# Patient Record
Sex: Male | Born: 1935 | Race: White | Hispanic: No | Marital: Married | Smoking: Former smoker
Health system: Southern US, Community
[De-identification: ages and names within clinical notes are randomized; demographics above are authoritative.]

## PROBLEM LIST (undated history)

## (undated) DIAGNOSIS — I739 Peripheral vascular disease, unspecified: Secondary | ICD-10-CM

## (undated) DIAGNOSIS — I6523 Occlusion and stenosis of bilateral carotid arteries: Secondary | ICD-10-CM

## (undated) DIAGNOSIS — G629 Polyneuropathy, unspecified: Secondary | ICD-10-CM

## (undated) DIAGNOSIS — I609 Nontraumatic subarachnoid hemorrhage, unspecified: Secondary | ICD-10-CM

## (undated) DIAGNOSIS — I2510 Atherosclerotic heart disease of native coronary artery without angina pectoris: Secondary | ICD-10-CM

## (undated) DIAGNOSIS — E119 Type 2 diabetes mellitus without complications: Secondary | ICD-10-CM

## (undated) HISTORY — PX: CORONARY ARTERY BYPASS GRAFT: SHX141

## (undated) HISTORY — PX: CHOLECYSTECTOMY: SHX55

## (undated) HISTORY — PX: HERNIA REPAIR: SHX51

---

## 1997-05-19 HISTORY — PX: CAROTID ENDARTERECTOMY: SUR193

## 2004-04-09 ENCOUNTER — Encounter: Payer: Self-pay | Admitting: *Deleted

## 2004-04-18 ENCOUNTER — Encounter: Payer: Self-pay | Admitting: *Deleted

## 2004-05-19 ENCOUNTER — Encounter: Payer: Self-pay | Admitting: *Deleted

## 2007-11-09 ENCOUNTER — Ambulatory Visit: Payer: Self-pay | Admitting: Internal Medicine

## 2007-11-10 ENCOUNTER — Ambulatory Visit: Payer: Self-pay | Admitting: Internal Medicine

## 2007-11-17 ENCOUNTER — Ambulatory Visit: Payer: Self-pay | Admitting: Internal Medicine

## 2007-12-18 ENCOUNTER — Ambulatory Visit: Payer: Self-pay | Admitting: Internal Medicine

## 2008-01-18 ENCOUNTER — Ambulatory Visit: Payer: Self-pay | Admitting: Internal Medicine

## 2008-02-17 ENCOUNTER — Ambulatory Visit: Payer: Self-pay | Admitting: Internal Medicine

## 2008-03-19 ENCOUNTER — Ambulatory Visit: Payer: Self-pay | Admitting: Internal Medicine

## 2008-04-04 ENCOUNTER — Ambulatory Visit: Payer: Self-pay | Admitting: Internal Medicine

## 2008-04-18 ENCOUNTER — Ambulatory Visit: Payer: Self-pay | Admitting: Internal Medicine

## 2008-06-19 ENCOUNTER — Ambulatory Visit: Payer: Self-pay | Admitting: Internal Medicine

## 2008-07-10 ENCOUNTER — Ambulatory Visit: Payer: Self-pay | Admitting: Internal Medicine

## 2008-07-17 ENCOUNTER — Ambulatory Visit: Payer: Self-pay | Admitting: Internal Medicine

## 2008-08-17 ENCOUNTER — Ambulatory Visit: Payer: Self-pay | Admitting: Internal Medicine

## 2008-10-12 ENCOUNTER — Ambulatory Visit: Payer: Self-pay | Admitting: Internal Medicine

## 2008-10-17 ENCOUNTER — Ambulatory Visit: Payer: Self-pay | Admitting: Internal Medicine

## 2008-12-17 ENCOUNTER — Ambulatory Visit: Payer: Self-pay | Admitting: Internal Medicine

## 2008-12-18 ENCOUNTER — Ambulatory Visit: Payer: Self-pay | Admitting: Internal Medicine

## 2009-01-17 ENCOUNTER — Ambulatory Visit: Payer: Self-pay | Admitting: Internal Medicine

## 2009-02-16 ENCOUNTER — Ambulatory Visit: Payer: Self-pay | Admitting: Internal Medicine

## 2009-03-19 ENCOUNTER — Ambulatory Visit: Payer: Self-pay | Admitting: Internal Medicine

## 2009-05-19 ENCOUNTER — Ambulatory Visit: Payer: Self-pay | Admitting: Internal Medicine

## 2009-05-24 ENCOUNTER — Ambulatory Visit: Payer: Self-pay | Admitting: Internal Medicine

## 2009-06-19 ENCOUNTER — Ambulatory Visit: Payer: Self-pay | Admitting: Internal Medicine

## 2009-08-22 ENCOUNTER — Ambulatory Visit: Payer: Self-pay | Admitting: Internal Medicine

## 2009-09-16 ENCOUNTER — Ambulatory Visit: Payer: Self-pay | Admitting: Internal Medicine

## 2009-11-16 ENCOUNTER — Ambulatory Visit: Payer: Self-pay | Admitting: Internal Medicine

## 2009-11-23 ENCOUNTER — Ambulatory Visit: Payer: Self-pay | Admitting: Internal Medicine

## 2009-12-17 ENCOUNTER — Ambulatory Visit: Payer: Self-pay | Admitting: Internal Medicine

## 2010-02-16 ENCOUNTER — Ambulatory Visit: Payer: Self-pay | Admitting: Internal Medicine

## 2010-03-01 ENCOUNTER — Ambulatory Visit: Payer: Self-pay | Admitting: Internal Medicine

## 2010-03-19 ENCOUNTER — Ambulatory Visit: Payer: Self-pay | Admitting: Internal Medicine

## 2010-05-31 ENCOUNTER — Ambulatory Visit: Payer: Self-pay | Admitting: Internal Medicine

## 2017-11-05 ENCOUNTER — Encounter (HOSPITAL_COMMUNITY): Payer: Self-pay | Admitting: Emergency Medicine

## 2017-11-05 ENCOUNTER — Emergency Department (HOSPITAL_COMMUNITY): Payer: Medicare HMO

## 2017-11-05 ENCOUNTER — Inpatient Hospital Stay (HOSPITAL_COMMUNITY)
Admission: EM | Admit: 2017-11-05 | Discharge: 2017-11-10 | DRG: 493 | Disposition: A | Discharge status: Skilled Nursing Facility | Payer: Medicare HMO | Attending: Internal Medicine | Admitting: Internal Medicine

## 2017-11-05 DIAGNOSIS — H9190 Unspecified hearing loss, unspecified ear: Secondary | ICD-10-CM | POA: Diagnosis present

## 2017-11-05 DIAGNOSIS — I2510 Atherosclerotic heart disease of native coronary artery without angina pectoris: Secondary | ICD-10-CM | POA: Diagnosis present

## 2017-11-05 DIAGNOSIS — S82201D Unspecified fracture of shaft of right tibia, subsequent encounter for closed fracture with routine healing: Secondary | ICD-10-CM | POA: Diagnosis present

## 2017-11-05 DIAGNOSIS — S82441A Displaced spiral fracture of shaft of right fibula, initial encounter for closed fracture: Secondary | ICD-10-CM | POA: Diagnosis not present

## 2017-11-05 DIAGNOSIS — S82241A Displaced spiral fracture of shaft of right tibia, initial encounter for closed fracture: Principal | ICD-10-CM | POA: Diagnosis present

## 2017-11-05 DIAGNOSIS — Z951 Presence of aortocoronary bypass graft: Secondary | ICD-10-CM

## 2017-11-05 DIAGNOSIS — Z7901 Long term (current) use of anticoagulants: Secondary | ICD-10-CM

## 2017-11-05 DIAGNOSIS — E1142 Type 2 diabetes mellitus with diabetic polyneuropathy: Secondary | ICD-10-CM | POA: Diagnosis present

## 2017-11-05 DIAGNOSIS — S82201A Unspecified fracture of shaft of right tibia, initial encounter for closed fracture: Secondary | ICD-10-CM

## 2017-11-05 DIAGNOSIS — I482 Chronic atrial fibrillation, unspecified: Secondary | ICD-10-CM

## 2017-11-05 DIAGNOSIS — I110 Hypertensive heart disease with heart failure: Secondary | ICD-10-CM | POA: Diagnosis present

## 2017-11-05 DIAGNOSIS — I5032 Chronic diastolic (congestive) heart failure: Secondary | ICD-10-CM | POA: Diagnosis present

## 2017-11-05 DIAGNOSIS — Z880 Allergy status to penicillin: Secondary | ICD-10-CM

## 2017-11-05 DIAGNOSIS — E1151 Type 2 diabetes mellitus with diabetic peripheral angiopathy without gangrene: Secondary | ICD-10-CM | POA: Diagnosis present

## 2017-11-05 DIAGNOSIS — W1839XA Other fall on same level, initial encounter: Secondary | ICD-10-CM | POA: Diagnosis not present

## 2017-11-05 DIAGNOSIS — S82401A Unspecified fracture of shaft of right fibula, initial encounter for closed fracture: Secondary | ICD-10-CM

## 2017-11-05 DIAGNOSIS — Z7902 Long term (current) use of antithrombotics/antiplatelets: Secondary | ICD-10-CM

## 2017-11-05 DIAGNOSIS — D696 Thrombocytopenia, unspecified: Secondary | ICD-10-CM | POA: Diagnosis present

## 2017-11-05 DIAGNOSIS — M79604 Pain in right leg: Secondary | ICD-10-CM | POA: Diagnosis present

## 2017-11-05 DIAGNOSIS — Z7984 Long term (current) use of oral hypoglycemic drugs: Secondary | ICD-10-CM

## 2017-11-05 DIAGNOSIS — Z9049 Acquired absence of other specified parts of digestive tract: Secondary | ICD-10-CM | POA: Diagnosis not present

## 2017-11-05 DIAGNOSIS — W010XXA Fall on same level from slipping, tripping and stumbling without subsequent striking against object, initial encounter: Secondary | ICD-10-CM | POA: Diagnosis present

## 2017-11-05 DIAGNOSIS — S82209A Unspecified fracture of shaft of unspecified tibia, initial encounter for closed fracture: Secondary | ICD-10-CM

## 2017-11-05 DIAGNOSIS — I6523 Occlusion and stenosis of bilateral carotid arteries: Secondary | ICD-10-CM | POA: Diagnosis present

## 2017-11-05 DIAGNOSIS — I480 Paroxysmal atrial fibrillation: Secondary | ICD-10-CM | POA: Diagnosis present

## 2017-11-05 DIAGNOSIS — E119 Type 2 diabetes mellitus without complications: Secondary | ICD-10-CM

## 2017-11-05 DIAGNOSIS — R791 Abnormal coagulation profile: Secondary | ICD-10-CM | POA: Diagnosis not present

## 2017-11-05 DIAGNOSIS — F17220 Nicotine dependence, chewing tobacco, uncomplicated: Secondary | ICD-10-CM | POA: Diagnosis present

## 2017-11-05 DIAGNOSIS — S82401D Unspecified fracture of shaft of right fibula, subsequent encounter for closed fracture with routine healing: Secondary | ICD-10-CM

## 2017-11-05 DIAGNOSIS — I5030 Unspecified diastolic (congestive) heart failure: Secondary | ICD-10-CM | POA: Diagnosis not present

## 2017-11-05 DIAGNOSIS — Z7951 Long term (current) use of inhaled steroids: Secondary | ICD-10-CM

## 2017-11-05 DIAGNOSIS — E11649 Type 2 diabetes mellitus with hypoglycemia without coma: Secondary | ICD-10-CM | POA: Diagnosis present

## 2017-11-05 DIAGNOSIS — Y93H2 Activity, gardening and landscaping: Secondary | ICD-10-CM

## 2017-11-05 DIAGNOSIS — T148XXA Other injury of unspecified body region, initial encounter: Secondary | ICD-10-CM

## 2017-11-05 DIAGNOSIS — W19XXXA Unspecified fall, initial encounter: Secondary | ICD-10-CM

## 2017-11-05 DIAGNOSIS — J449 Chronic obstructive pulmonary disease, unspecified: Secondary | ICD-10-CM | POA: Diagnosis present

## 2017-11-05 DIAGNOSIS — I739 Peripheral vascular disease, unspecified: Secondary | ICD-10-CM

## 2017-11-05 HISTORY — DX: Occlusion and stenosis of bilateral carotid arteries: I65.23

## 2017-11-05 HISTORY — DX: Nontraumatic subarachnoid hemorrhage, unspecified: I60.9

## 2017-11-05 HISTORY — DX: Peripheral vascular disease, unspecified: I73.9

## 2017-11-05 HISTORY — DX: Atherosclerotic heart disease of native coronary artery without angina pectoris: I25.10

## 2017-11-05 HISTORY — DX: Type 2 diabetes mellitus without complications: E11.9

## 2017-11-05 HISTORY — DX: Polyneuropathy, unspecified: G62.9

## 2017-11-05 LAB — GLUCOSE, CAPILLARY: GLUCOSE-CAPILLARY: 138 mg/dL — AB (ref 65–99)

## 2017-11-05 LAB — CBC
HCT: 40.2 % (ref 39.0–52.0)
Hemoglobin: 13.4 g/dL (ref 13.0–17.0)
MCH: 32.8 pg (ref 26.0–34.0)
MCHC: 33.3 g/dL (ref 30.0–36.0)
MCV: 98.5 fL (ref 78.0–100.0)
PLATELETS: 110 10*3/uL — AB (ref 150–400)
RBC: 4.08 MIL/uL — ABNORMAL LOW (ref 4.22–5.81)
RDW: 13.3 % (ref 11.5–15.5)
WBC: 6.2 10*3/uL (ref 4.0–10.5)

## 2017-11-05 LAB — COMPREHENSIVE METABOLIC PANEL
ALBUMIN: 4.3 g/dL (ref 3.5–5.0)
ALT: 21 U/L (ref 17–63)
ANION GAP: 8 (ref 5–15)
AST: 35 U/L (ref 15–41)
Alkaline Phosphatase: 87 U/L (ref 38–126)
BUN: 22 mg/dL — AB (ref 6–20)
CHLORIDE: 111 mmol/L (ref 101–111)
CO2: 26 mmol/L (ref 22–32)
Calcium: 9.4 mg/dL (ref 8.9–10.3)
Creatinine, Ser: 1.23 mg/dL (ref 0.61–1.24)
GFR calc Af Amer: >60 mL/min (ref >60)
GFR, EST NON AFRICAN AMERICAN: 52 mL/min — AB (ref >60)
Glucose, Bld: 109 mg/dL — ABNORMAL HIGH (ref 65–99)
POTASSIUM: 4.4 mmol/L (ref 3.5–5.1)
Sodium: 145 mmol/L (ref 135–145)
Total Bilirubin: 1.4 mg/dL — ABNORMAL HIGH (ref 0.3–1.2)
Total Protein: 6.9 g/dL (ref 6.5–8.1)

## 2017-11-05 LAB — PROTIME-INR
INR: 1.78
PROTHROMBIN TIME: 20.6 s — AB (ref 11.4–15.2)

## 2017-11-05 LAB — CBG MONITORING, ED: GLUCOSE-CAPILLARY: 69 mg/dL (ref 65–99)

## 2017-11-05 MED ORDER — FENTANYL CITRATE (PF) 100 MCG/2ML IJ SOLN
12.5000 ug | Freq: Once | INTRAMUSCULAR | Status: AC
  Administered 2017-11-05: 12.5 ug via INTRAVENOUS
  Filled 2017-11-05: qty 2

## 2017-11-05 MED ORDER — INSULIN ASPART 100 UNIT/ML ~~LOC~~ SOLN
0.0000 [IU] | Freq: Three times a day (TID) | SUBCUTANEOUS | Status: DC
  Administered 2017-11-06: 8 [IU] via SUBCUTANEOUS
  Administered 2017-11-07: 3 [IU] via SUBCUTANEOUS
  Administered 2017-11-07: 2 [IU] via SUBCUTANEOUS
  Administered 2017-11-08: 3 [IU] via SUBCUTANEOUS

## 2017-11-05 MED ORDER — METOPROLOL TARTRATE 25 MG PO TABS
25.0000 mg | ORAL_TABLET | Freq: Two times a day (BID) | ORAL | Status: DC
  Administered 2017-11-06 – 2017-11-10 (×9): 25 mg via ORAL
  Filled 2017-11-05 (×9): qty 1

## 2017-11-05 MED ORDER — ONDANSETRON HCL 4 MG/2ML IJ SOLN
4.0000 mg | Freq: Once | INTRAMUSCULAR | Status: AC
  Administered 2017-11-05: 4 mg via INTRAVENOUS
  Filled 2017-11-05: qty 2

## 2017-11-05 MED ORDER — ACETAMINOPHEN 325 MG PO TABS
650.0000 mg | ORAL_TABLET | Freq: Four times a day (QID) | ORAL | Status: DC | PRN
  Administered 2017-11-05 – 2017-11-10 (×5): 650 mg via ORAL
  Filled 2017-11-05 (×6): qty 2

## 2017-11-05 MED ORDER — ACETAMINOPHEN 650 MG RE SUPP: 650.0000 mg | Freq: Four times a day (QID) | RECTAL | Status: DC | PRN

## 2017-11-05 MED ORDER — SODIUM CHLORIDE 0.9 % IV BOLUS
250.0000 mL | Freq: Once | INTRAVENOUS | Status: AC
  Administered 2017-11-05: 250 mL via INTRAVENOUS

## 2017-11-05 MED ORDER — HEPARIN SODIUM (PORCINE) 5000 UNIT/ML IJ SOLN
5000.0000 [IU] | Freq: Three times a day (TID) | INTRAMUSCULAR | Status: DC
  Administered 2017-11-05 – 2017-11-08 (×8): 5000 [IU] via SUBCUTANEOUS
  Filled 2017-11-05 (×8): qty 1

## 2017-11-05 MED ORDER — CLOPIDOGREL BISULFATE 75 MG PO TABS
75.0000 mg | ORAL_TABLET | Freq: Every day | ORAL | Status: DC
  Administered 2017-11-07 – 2017-11-10 (×4): 75 mg via ORAL
  Filled 2017-11-05 (×4): qty 1

## 2017-11-05 MED ORDER — LISINOPRIL 5 MG PO TABS
5.0000 mg | ORAL_TABLET | Freq: Every day | ORAL | Status: DC
  Administered 2017-11-07 – 2017-11-10 (×4): 5 mg via ORAL
  Filled 2017-11-05 (×4): qty 1

## 2017-11-05 MED ORDER — ONDANSETRON HCL 4 MG/2ML IJ SOLN
4.0000 mg | Freq: Four times a day (QID) | INTRAMUSCULAR | Status: DC | PRN
  Administered 2017-11-06 – 2017-11-07 (×2): 4 mg via INTRAVENOUS
  Filled 2017-11-05 (×2): qty 2

## 2017-11-05 MED ORDER — PREGABALIN 75 MG PO CAPS
150.0000 mg | ORAL_CAPSULE | Freq: Two times a day (BID) | ORAL | Status: DC
  Administered 2017-11-06 – 2017-11-10 (×8): 150 mg via ORAL
  Filled 2017-11-05 (×4): qty 2
  Filled 2017-11-05: qty 1
  Filled 2017-11-05 (×4): qty 2

## 2017-11-05 MED ORDER — ONDANSETRON HCL 4 MG PO TABS
4.0000 mg | ORAL_TABLET | Freq: Four times a day (QID) | ORAL | Status: DC | PRN
  Administered 2017-11-07: 4 mg via ORAL
  Filled 2017-11-05: qty 1

## 2017-11-05 MED ORDER — FLUTICASONE PROPIONATE 50 MCG/ACT NA SUSP
2.0000 | Freq: Two times a day (BID) | NASAL | Status: DC | PRN
  Filled 2017-11-05: qty 16

## 2017-11-05 MED ORDER — PRAVASTATIN SODIUM 40 MG PO TABS
40.0000 mg | ORAL_TABLET | Freq: Every day | ORAL | Status: DC
  Administered 2017-11-06 – 2017-11-09 (×4): 40 mg via ORAL
  Filled 2017-11-05 (×4): qty 1

## 2017-11-05 MED ORDER — SODIUM CHLORIDE 0.9 % IV SOLN
INTRAVENOUS | Status: DC
  Administered 2017-11-05: 21:00:00 via INTRAVENOUS

## 2017-11-05 MED ORDER — INSULIN ASPART 100 UNIT/ML ~~LOC~~ SOLN: 0.0000 [IU] | Freq: Every day | SUBCUTANEOUS | Status: DC

## 2017-11-05 MED ORDER — OXYCODONE HCL 5 MG PO TABS
5.0000 mg | ORAL_TABLET | ORAL | Status: DC | PRN
  Administered 2017-11-06 – 2017-11-09 (×13): 5 mg via ORAL
  Filled 2017-11-05 (×14): qty 1

## 2017-11-05 MED ORDER — DEXTROSE 50 % IV SOLN
1.0000 | Freq: Once | INTRAVENOUS | Status: AC
  Administered 2017-11-05: 50 mL via INTRAVENOUS
  Filled 2017-11-05: qty 50

## 2017-11-05 NOTE — ED Notes (Signed)
Patient transported to X-ray 

## 2017-11-05 NOTE — ED Notes (Signed)
CBG: 69 RN notified 

## 2017-11-05 NOTE — ED Triage Notes (Signed)
[facility] coming from home c/o right leg pain after falling while walking backwards pulling a piece of equipment. EMS reports deformity, but good pulses in right leg. A/o x4. Patient takes warfarin.

## 2017-11-05 NOTE — ED Provider Notes (Addendum)
[facility] EMERGENCY DEPARTMENT Provider Note   CSN: [id number] Arrival date & time: 11/05/17  1548     History   Chief Complaint Chief Complaint  Patient presents with  . Leg Injury    HPI Jimmy Lawrence is a 84 y.o. male.  Patient brought in by cast will [facility].  From home.  Patient was pulling on the lawn more slipped fell backwards twisting his right leg.  Has pain in right leg it was deformed at home.  Patient is on Coumadin.  They are not 100% certain why.  Patient has a known history of diabetes.  No other injuries.  Other than an abrasion to his left anterior shin.  Patient's tetanus is up-to-date.  Did not hit his head.  No neck pain no back pain no loss of consciousness.  Based on patient's labs.  Also probably has a history of COPD.  May have a history of hypertension.     History reviewed. No pertinent past medical history.  There are no active problems to display for this patient.   History reviewed. No pertinent surgical history.      Home Medications    Prior to Admission medications   Medication Sig Start Date End Date Taking? Authorizing Provider  acetaminophen (TYLENOL) 500 MG tablet Take 1,000 mg by mouth every 6 (six) hours as needed for headache (pain).   Yes [provider]  albuterol (PROVENTIL HFA;VENTOLIN HFA) 108 (90 Base) MCG/ACT inhaler Inhale 1 puff into the lungs every 6 (six) hours as needed for wheezing or shortness of breath.   Yes [provider]  clopidogrel (PLAVIX) 75 MG tablet Take 75 mg by mouth daily with breakfast. 10/25/17  Yes [provider]  febuxostat (ULORIC) 40 MG tablet Take 40 mg by mouth daily with supper.   Yes [provider]  fluticasone (FLONASE) 50 MCG/ACT nasal spray Place 2 sprays into the nose 2 (two) times daily as needed (seasonal allergies).  05/06/11  Yes [provider]  furosemide (LASIX) 40 MG tablet Take 40 mg by mouth daily as needed for  fluid or edema (weight gain of 3 lbs over a couple days).    Yes [provider]  glipiZIDE (GLUCOTROL XL) 10 MG 24 hr tablet Take 10 mg by mouth daily with breakfast.   Yes [provider]  loperamide (IMODIUM) 2 MG capsule Take 2 mg by mouth every 4 (four) hours as needed for diarrhea or loose stools.  11/23/12  Yes [provider]  loratadine (CLARITIN) 10 MG tablet Take 10 mg by mouth daily as needed (seasonal allergies).    Yes [provider]  lovastatin (MEVACOR) 40 MG tablet Take 40 mg by mouth daily with supper.  08/20/17  Yes [provider]  magnesium oxide (MAG-OX) 400 MG tablet Take 400 mg by mouth daily with breakfast. 09/06/17  Yes [provider]  meclizine (ANTIVERT) 25 MG tablet Take 25 mg by mouth 3 (three) times daily as needed for dizziness.  04/27/09  Yes [provider]  metFORMIN (GLUCOPHAGE-XR) 500 MG 24 hr tablet Take 500 mg by mouth daily with supper.  10/25/17  Yes [provider]  metoprolol tartrate (LOPRESSOR) 25 MG tablet Take 25 mg by mouth 2 (two) times daily with a meal. 10/25/17  Yes [provider]  nitroGLYCERIN (NITROSTAT) 0.4 MG SL tablet Place 0.4 mg under the tongue every 5 (five) minutes as needed for chest pain.  04/27/09  Yes [provider]  Omega-3 Fatty Acids (FISH OIL) 1200 MG CAPS Take 1,200 mg by mouth daily with supper.   Yes [provider]  potassium chloride SA (K-DUR,KLOR-CON) 20 MEQ tablet Take 20 mEq by mouth daily with breakfast.  10/05/17  Yes [provider]  pregabalin (LYRICA) 150 MG capsule Take 150 mg by mouth 2 (two) times daily with a meal.   Yes [provider]  quinapril (ACCUPRIL) 5 MG tablet Take 5 mg by mouth daily with breakfast.  10/05/17  Yes [provider]  warfarin (COUMADIN) 5 MG tablet Take 2.5-5 mg by mouth See admin instructions. Take 1/2 tablet (2.5 mg) by mouth on Monday, Wednesday, Friday with supper; take  1 tablet (5 mg) on Sunday, Tuesday, Thursday, Saturday with supper   Yes [provider]    Family History No family history on file.  Social History Social History   Tobacco Use  . Smoking status: Never Smoker  . Smokeless tobacco: Current User    Types: Chew  Substance Use Topics  . Alcohol use: Not on file  . Drug use: Not on file     Allergies   Penicillins   Review of Systems Review of Systems  Constitutional: Negative for fever.  HENT: Negative for congestion.   Eyes: Negative for visual disturbance.  Respiratory: Negative for shortness of breath.   Cardiovascular: Negative for chest pain.  Gastrointestinal: Negative for abdominal pain.  Genitourinary: Negative for dysuria.  Musculoskeletal: Negative for back pain and neck pain.  Skin: Positive for wound.  Neurological: Negative for syncope and headaches.     Physical Exam Updated Vital Signs BP (!) 166/95   Pulse 89   Temp 97.8 F (36.6 C) (Oral)   Resp 19   Ht 1.702 m (5\' 7" )   Wt 83.9 kg (185 lb)   SpO2 97%   BMI 28.98 kg/m   Physical Exam  Constitutional: He is oriented to person, place, and time. He appears well-developed and well-nourished. No distress.  HENT:  Head: Normocephalic and atraumatic.  Mouth/Throat: Oropharynx is clear and moist.  Eyes: Pupils are equal, round, and reactive to light. EOM are normal.  Neck: Normal range of motion. Neck supple.  Cardiovascular: Normal rate and regular rhythm.  Pulmonary/Chest: Effort normal and breath sounds normal. No respiratory distress.  Abdominal: Soft. Bowel sounds are normal. There is no tenderness.  Musculoskeletal: Normal range of motion. He exhibits tenderness and deformity.  Right leg with a slight deformity to the distal one third of the leg.  Toes with good cap refill.  Sensation intact good movement at the toes.  No swelling to the knee.  Does have some proximal fibula tenderness as well.  Hip nontender.  No other extremity  injuries other than left anterior shin with superficial abrasions no active bleeding.  No evidence of any open wound to the right leg.  Neurological: He is alert and oriented to person, place, and time. No cranial nerve deficit. He exhibits normal muscle tone. Coordination normal.  Skin: Skin is warm.  Nursing note and vitals reviewed.    ED Treatments / Results  Labs (all labs ordered are listed, but only abnormal results are displayed) Labs Reviewed  CBC - Abnormal; Notable for the following components:      Result Value   RBC 4.08 (*)    Platelets 110 (*)    All other components within normal limits  COMPREHENSIVE METABOLIC PANEL - Abnormal; Notable for the following components:   Glucose, Bld  109 (*)    BUN 22 (*)    Total Bilirubin 1.4 (*)    GFR calc non Af Amer 52 (*)    All other components within normal limits  PROTIME-INR  CBG MONITORING, ED    EKG None  Radiology Dg Tibia/fibula Right  Result Date: 11/05/2017 CLINICAL DATA:  Fall. EXAM: RIGHT TIBIA AND FIBULA - 2 VIEW COMPARISON:  None. FINDINGS: Mildly displaced fracture proximal fibula. Displaced spiral fracture distal tibia not extending into the ankle joint. Ankle joint and knee joint normal. Arterial calcification. IMPRESSION: Mildly displaced fracture proximal fibula. Displaced spiral fracture distal tibia. Electronically Signed   By: Marlan Palauharles  Clark M.D.   On: 11/05/2017 17:54   Dg Ankle Complete Right  Result Date: 11/05/2017 CLINICAL DATA:  Fall EXAM: RIGHT ANKLE - COMPLETE 3+ VIEW COMPARISON:  Right tib fib FINDINGS: Spiral fracture distal tibia with displacement. Fracture does not extend to the ankle joint. Distal fibula intact. Ankle joint normal. Arterial calcification IMPRESSION: Spiral fracture distal tibia not involving the ankle joint. Electronically Signed   By: Marlan Palauharles  Clark M.D.   On: 11/05/2017 17:55    Procedures Procedures (including critical care time)  Medications Ordered in  ED Medications  0.9 %  sodium chloride infusion (has no administration in time range)  sodium chloride 0.9 % bolus 250 mL (has no administration in time range)  ondansetron (ZOFRAN) injection 4 mg (4 mg Intravenous Given 11/05/17 1955)  fentaNYL (SUBLIMAZE) injection 12.5 mcg (12.5 mcg Intravenous Given 11/05/17 1955)  dextrose 50 % solution 50 mL (50 mLs Intravenous Given 11/05/17 2043)     Initial Impression / Assessment and Plan / ED Course  I have reviewed the triage vital signs and the nursing notes.  Pertinent labs & imaging results that were available during my care of the patient were reviewed by me and considered in my medical decision making (see chart for details).     X-rays show a tibia spiral fracture as well as a proximal fibula fracture.  Ankle is normal.  Orthopedic consulted.  Waiting on their opinion later this week fixed by cast or by surgery.  We will go ahead and place him in a stirrup and posterior splint.  Either way due to patient's age will probably require medical admission all not able to take care of himself at home at this time.  Final Clinical Impressions(s) / ED Diagnoses   Final diagnoses:  Fall, initial encounter  Closed fracture of right tibia and fibula, initial encounter    ED Discharge Orders    None      Patient's fingerstick rechecked here.  Blood sugar down to 60s we will give an amp of D50.  We will start some IV fluids.  We will go ahead and order the splint.   Vanetta MuldersZackowski, Ellyn Rubiano, MD 11/05/17 2045  Dr. August Saucerean from orthopedics here to see patient now.    Vanetta MuldersZackowski, Carra Brindley, MD 11/05/17 [phone]

## 2017-11-05 NOTE — Progress Notes (Signed)
Orthopedic Tech Progress Note Patient Details:  Trevor MaceKenneth Mccullars Aug 07, 1932 [id number]  Ortho Devices Type of Ortho Device: Ace wrap, Stirrup splint, Post (long leg) splint Ortho Device/Splint Location: RLE Ortho Device/Splint Interventions: Ordered, Application   Post Interventions Patient Tolerated: Well Instructions Provided: Care of device   Jennye MoccasinHughes, Duaa Stelzner Craig 11/05/2017, 9:15 PM

## 2017-11-05 NOTE — ED Notes (Signed)
Othro tech at placing splint with Dr. Shari HeritageScoot Dean.

## 2017-11-05 NOTE — Consult Note (Addendum)
Reason for Consult: Right leg pain Referring Physician: Dr Miles  Jimmy Lawrence is an 84 y.o. male.  HPI: Jimmy Lawrence is an 84-year-old patient who fell today.  This was a low-energy mechanical fall.  He reports right leg pain.  Denies any other orthopedic complaints.  The patient is on Coumadin for atrial fibrillation and has diabetes and COPD as medical comorbidities.  He is here with his wife and supportive extended family  History reviewed. No pertinent past medical history.  History reviewed. No pertinent surgical history.  No family history on file.  Social History:  reports that he has never smoked. His smokeless tobacco use includes chew. His alcohol and drug histories are not on file.  Allergies:  Allergies  Allergen Reactions  . Penicillins Hives    Has patient had a PCN reaction causing immediate rash, facial/tongue/throat swelling, SOB or lightheadedness with hypotension: No Has patient had a PCN reaction causing severe rash involving mucus membranes or skin necrosis: No Has patient had a PCN reaction that required hospitalization: No Has patient had a PCN reaction occurring within the last 10 years: No If all of the above answers are "NO", then may proceed with Cephalosporin use.    Medications: I have reviewed the patient's current medications.  Results for orders placed or performed during the hospital encounter of 11/05/17 (from the past 48 hour(s))  CBC     Status: Abnormal   Collection Time: 11/05/17  5:15 PM  Result Value Ref Range   WBC 6.2 4.0 - 10.5 K/uL   RBC 4.08 (L) 4.22 - 5.81 MIL/uL   Hemoglobin 13.4 13.0 - 17.0 g/dL   HCT 40.2 39.0 - 52.0 %   MCV 98.5 78.0 - 100.0 fL   MCH 32.8 26.0 - 34.0 pg   MCHC 33.3 30.0 - 36.0 g/dL   RDW 13.3 11.5 - 15.5 %   Platelets 110 (L) 150 - 400 K/uL    Comment: PLATELET COUNT CONFIRMED BY SMEAR Performed at [facility], [address] [place], [place] [address]   Comprehensive metabolic panel     Status:  Abnormal   Collection Time: 11/05/17  5:15 PM  Result Value Ref Range   Sodium 145 135 - 145 mmol/L   Potassium 4.4 3.5 - 5.1 mmol/L   Chloride 111 101 - 111 mmol/L   CO2 26 22 - 32 mmol/L   Glucose, Bld 109 (H) 65 - 99 mg/dL   BUN 22 (H) 6 - 20 mg/dL   Creatinine, Ser 1.23 0.61 - 1.24 mg/dL   Calcium 9.4 8.9 - 10.3 mg/dL   Total Protein 6.9 6.5 - 8.1 g/dL   Albumin 4.3 3.5 - 5.0 g/dL   AST 35 15 - 41 U/L   ALT 21 17 - 63 U/L   Alkaline Phosphatase 87 38 - 126 U/L   Total Bilirubin 1.4 (H) 0.3 - 1.2 mg/dL   GFR calc non Af Amer 52 (L) >60 mL/min   GFR calc Af Amer >60 >60 mL/min    Comment: (NOTE) The eGFR has been calculated using the CKD EPI equation. This calculation has not been validated in all clinical situations. eGFR's persistently <60 mL/min signify possible Chronic Kidney Disease.    Anion gap 8 5 - 15    Comment: Performed at [facility], [address] ,  [address]  Protime-INR     Status: Abnormal   Collection Time: 11/05/17  7:20 PM  Result Value Ref Range   Prothrombin   Time 20.6 (H) 11.4 - 15.2 seconds   INR 1.78     Comment: Performed at [facility], [place] [address], [place], [place] [address]  CBG monitoring, ED     Status: None   Collection Time: 11/05/17  8:34 PM  Result Value Ref Range   Glucose-Capillary 69 65 - 99 mg/dL   Comment 1 Notify RN    Comment 2 Document in Chart     Dg Tibia/fibula Right  Result Date: 11/05/2017 CLINICAL DATA:  Fall. EXAM: RIGHT TIBIA AND FIBULA - 2 VIEW COMPARISON:  None. FINDINGS: Mildly displaced fracture proximal fibula. Displaced spiral fracture distal tibia not extending into the ankle joint. Ankle joint and knee joint normal. Arterial calcification. IMPRESSION: Mildly displaced fracture proximal fibula. Displaced spiral fracture distal tibia. Electronically Signed   By: Franchot Gallo M.D.   On: 11/05/2017 17:54   Dg Ankle Complete Right  Result Date: 11/05/2017 CLINICAL DATA:  Fall  EXAM: RIGHT ANKLE - COMPLETE 3+ VIEW COMPARISON:  Right tib fib FINDINGS: Spiral fracture distal tibia with displacement. Fracture does not extend to the ankle joint. Distal fibula intact. Ankle joint normal. Arterial calcification IMPRESSION: Spiral fracture distal tibia not involving the ankle joint. Electronically Signed   By: Franchot Gallo M.D.   On: 11/05/2017 17:55   Dg Chest Port 1 View  Result Date: 11/05/2017 CLINICAL DATA:  Fall, fracture EXAM: PORTABLE CHEST 1 VIEW COMPARISON:  04/04/2008 FINDINGS: Rotated patient. Post sternotomy changes. Enlarge cardiomediastinal silhouette with vascular congestion mild diffuse interstitial opacity suspect for minimal edema. No pleural effusion or pneumothorax. Aortic atherosclerosis. IMPRESSION: Cardiomegaly with mild vascular congestion and mild interstitial edema. Electronically Signed   By: Donavan Foil M.D.   On: 11/05/2017 20:59    Review of Systems  HENT: Positive for hearing loss.   Musculoskeletal: Positive for joint pain.  All other systems reviewed and are negative.  Blood pressure (!) 174/101, pulse (!) 46, temperature 97.8 F (36.6 C), temperature source Oral, resp. rate 20, height 5' 7" (1.702 m), weight 185 lb (83.9 kg), SpO2 97 %. Physical Exam  Constitutional: He appears well-developed.  HENT:  Head: Normocephalic.  Eyes: Pupils are equal, round, and reactive to light.  Neck: Normal range of motion.  Cardiovascular: Normal rate.  Respiratory: Effort normal.  Neurological: He is alert.  Skin: Skin is warm.  Psychiatric: He has a normal mood and affect.  Examination of the right lower extremity demonstrates soft compartments.  Fracture is closed but it does have an area of erythema distally near the fracture site.  He also has a very small abrasion in the central portion of the patellar tendon which may potentially interfere with nail placement.  Ankle dorsiflexion is intact.  Both feet are perfused but palpable pedal pulses are  generally diminished in both feet.  Assessment/Plan: Impression is displaced spiral tib-fib fracture low energy with no evidence of compartment syndrome at this time.  INR is pending.  Coumadin has been held.  I think the patient would benefit from intramedullary nail placement.  Medical admission for optimization.  Plan surgery tomorrow with 1 of my partners.  I did help the [employer] put the long-leg splint on and place the leg and a little bit of recurvatum at the fracture site in order to diminish any pressure on the skin.  Landry Dyke Dean 11/05/2017, 9:22 PM

## 2017-11-05 NOTE — ED Notes (Signed)
Pt back from X-ray.  

## 2017-11-05 NOTE — H&P (Addendum)
Date: 11/05/2017               Patient Name:  Jimmy Lawrence MRN: [id number]  DOB: 04-10-1933 Age / Sex: 84 y.o., male   PCP: Leanna Sato, MD         Medical Service: Internal Medicine Teaching Service         Attending Physician: Dr. Earl Lagos, MD    First Contact: Dr. Alinda Money Pager: [phone]  Second Contact: Dr. Vincente Liberty Pager: [phone]       After Hours (After 5p/  First Contact Pager: [phone]  weekends / holidays): Second Contact Pager: [phone]   Chief Complaint: Right leg pain   History of Present Illness: Mr. Knittle is a 84 y.o male with CAD s/p CABG in 2004, HFpEF, CVD (L CEA in 1999 complicated by [facility] and right CEA occulusion), PAD, DM, HTN, and Chronic A-Fib who presented to the ED with right leg pain.  Patient states that he was mowing his lawn today, was backing it up, lost balance and fell backwards. He was unable to get up so he dragged himself to the gate and got his neighbors' attention who called EMS. He denies head injury or loss of consciousness. He denies associated chest pain, shortness of breath, palpitations, dizziness, vision changes or weakness. He endorses numbness and tingling of bil LEs (mainly feet) which is chronic and unchanged for him.   Patient denies abd pain, nausea, vomiting, diarrhea, constipation. He endorses a mild, intermittent cough which is chronic for him. He does note worse feet pain at night which improves with dangling his legs off the bed for a short period of time. He is very active and still takes care of all of the yardwork for his property.   Meds:  Current Meds  Medication Sig  . acetaminophen (TYLENOL) 500 MG tablet Take 1,000 mg by mouth every 6 (six) hours as needed for headache (pain).  Marland Kitchen albuterol (PROVENTIL HFA;VENTOLIN HFA) 108 (90 Base) MCG/ACT inhaler Inhale 1 puff into the lungs every 6 (six) hours as needed for wheezing or shortness of breath.  . clopidogrel (PLAVIX) 75 MG tablet Take 75 mg by mouth daily with  breakfast.  . febuxostat (ULORIC) 40 MG tablet Take 40 mg by mouth daily with supper.  . fluticasone (FLONASE) 50 MCG/ACT nasal spray Place 2 sprays into the nose 2 (two) times daily as needed (seasonal allergies).   . furosemide (LASIX) 40 MG tablet Take 40 mg by mouth daily as needed for fluid or edema (weight gain of 3 lbs over a couple days).   Marland Kitchen glipiZIDE (GLUCOTROL XL) 10 MG 24 hr tablet Take 10 mg by mouth daily with breakfast.  . loperamide (IMODIUM) 2 MG capsule Take 2 mg by mouth every 4 (four) hours as needed for diarrhea or loose stools.   Marland Kitchen loratadine (CLARITIN) 10 MG tablet Take 10 mg by mouth daily as needed (seasonal allergies).   Marland Kitchen lovastatin (MEVACOR) 40 MG tablet Take 40 mg by mouth daily with supper.   . magnesium oxide (MAG-OX) 400 MG tablet Take 400 mg by mouth daily with breakfast.  . meclizine (ANTIVERT) 25 MG tablet Take 25 mg by mouth 3 (three) times daily as needed for dizziness.   . metFORMIN (GLUCOPHAGE-XR) 500 MG 24 hr tablet Take 500 mg by mouth daily with supper.   . metoprolol tartrate (LOPRESSOR) 25 MG tablet Take 25 mg by mouth 2 (two) times daily with a meal.  . nitroGLYCERIN (NITROSTAT) 0.4  MG SL tablet Place 0.4 mg under the tongue every 5 (five) minutes as needed for chest pain.   . Omega-3 Fatty Acids (FISH OIL) 1200 MG CAPS Take 1,200 mg by mouth daily with supper.  . potassium chloride SA (K-DUR,KLOR-CON) 20 MEQ tablet Take 20 mEq by mouth daily with breakfast.   . pregabalin (LYRICA) 150 MG capsule Take 150 mg by mouth 2 (two) times daily with a meal.  . quinapril (ACCUPRIL) 5 MG tablet Take 5 mg by mouth daily with breakfast.   . warfarin (COUMADIN) 5 MG tablet Take 2.5-5 mg by mouth See admin instructions. Take 1/2 tablet (2.5 mg) by mouth on Monday, Wednesday, Friday with supper; take 1 tablet (5 mg) on Sunday, Tuesday, Thursday, Saturday with supper   Allergies: Allergies as of 11/05/2017 - Review Complete 11/05/2017  Allergen Reaction Noted  .  Penicillins Hives 11/05/2017   History reviewed. No pertinent past medical history.  Family History: Patient denies family history of stroke, diabetes, or heart disease  Social History: Occupation was Visual merchandiserfarmer; he is a former smoker, now uses chewing tobacco. He denies illicit drug use and does not drink alcohol.   Review of Systems: A complete ROS was negative except as per HPI.   Physical Exam: Blood pressure 129/82, pulse 73, temperature 98 F (36.7 C), temperature source Oral, resp. rate 16, height 5\' 7"  (1.702 m), weight 185 lb (83.9 kg), SpO2 97 %.  General: Well nourished, in no acute distress HENT: Normocephalic, atraumatic, moist mucus membranes; R carotid bruit Pulm: CTAB, no increased work of breathing, no rales or wheezing CV: RRR, no murmurs, rubs or gallops Abdomen: Active bowel sounds, soft, non-distended, no tenderness to palpation  Extremities: R leg in dressing; able to move toes, good cap refill though unable to palpate for pulses due to dressing. L leg with 1+ DP/PT. Bilateral onychomycosis  Skin: Warm and dry; small abrasion on right shin Neuro: Alert and oriented x 3, moving all extremities, CN 2-12 grossly intact Psych: Normal mood and affect, appropriate thought content and speech.  EKG: personally reviewed my interpretation is A fib, no acute ST or T wave changes.  CXR: personally reviewed, my interpretation is cardiomegaly with post-sternotomy changes; no effusion; mild vascular congestion.  DG R Ankle: Spiral fracture distal tibia not involving the ankle joint  DG R Tibia/Fibula: Mildly displaced fracture proximal fibula; displaced spiral fracture distal tibia.   Assessment & Plan by Problem: Principal Problem:   Tibia/fibula fracture, right, closed, initial encounter Active Problems:   (HFpEF) heart failure with preserved ejection fraction (HCC)   T2DM (type 2 diabetes mellitus) (HCC)   Chronic atrial fibrillation (HCC)   Chronic anticoagulation    CAD, multiple vessel   PAD (peripheral artery disease) (HCC)  Right Displaced Spiral Tib-Fib Fracture: Secondary to mechanical fall; patient denies any symptoms that would be concerning for cardiovascular or neurologic cause of his fall. Ortho has evaluated patient; plan for surgery tomorrow for intramedulary nail placement. His RLE has been placed in long-leg splint for stabilization overnight. He is neurovascularly intact.  --surgery in AM; appreciate ortho assistance --holding warfarin --pain control with tylenol, oxy IR prn  T2DM, with peripheral neuropathy: On glipizide 10 mg QD, Metformin 500 mg QD at home. On Lyrica 150mg  BID for neuropathy. He had episode of hypoglycemia to 69 in ED which was treated.  -- SSI-S wc  Chronic Atrial Fibrillation CHA2DS2-VASc Score of 8. EKG on admission showed rate controlled A fib. He is asymptomatic. INR subtherapeutic at  1.78. -- Continue Metoprolol 25 mg BID  -- Hold Warfarin in anticipation for ORIF of the right LE tomorrow; due to high risk will need bridging  HFpEF: Recent echocardiogram per care everywhere in 06/2014 with LVEF 55% and LVH with hyperlipomatosis of IAS. No valvular abnormalities noted; R atrial dilation. On metoprolol 25mg  BID, quinapril 5mg  daily and furosemide 40mg  PRN at home.  --Continue Metoprolol 25 mg BID, Quinapril 5 mg  CAD s/p CABG: No ischemic signs or symptoms.  --Continue Clopidogrel 75 mg QD, Lovastatin 40 mg QD, and Quinapril 5 mg QD  PAD: Recently underwent revascularization (March 2019) of his right anterior tibial artery w/o stent placement.  --Continuing lifestyle modification and risk factor control as outlined above   Carotid Artery Stenosis: Bilateral carotid dopplers on 11/03/2017 illustrating chronically occluded right ICA and 50% stenosis of the left ICA.  --Continuing lifestyle modification and risk factor control as outlined above  IVF: none Diet: CM/HH, then NPO at midnight DVT ppx: sq  heparin, hold 6hr prior to surgery Code: FULL, discussed with patient at admission.   Dispo: Admit patient to Inpatient with expected length of stay greater than 2 midnights.  Signed: Nyra Market, MD 11/05/2017, 10:22 PM  My Pager: [phone]

## 2017-11-06 ENCOUNTER — Encounter (HOSPITAL_COMMUNITY): Payer: Self-pay | Admitting: Anesthesiology

## 2017-11-06 ENCOUNTER — Inpatient Hospital Stay (HOSPITAL_COMMUNITY): Payer: Medicare HMO

## 2017-11-06 ENCOUNTER — Inpatient Hospital Stay (HOSPITAL_COMMUNITY): Payer: Medicare HMO | Admitting: Anesthesiology

## 2017-11-06 ENCOUNTER — Encounter (HOSPITAL_COMMUNITY)
Admission: EM | Disposition: A | Discharge status: Skilled Nursing Facility | Payer: Self-pay | Source: Home / Self Care | Attending: Internal Medicine

## 2017-11-06 DIAGNOSIS — Z7901 Long term (current) use of anticoagulants: Secondary | ICD-10-CM

## 2017-11-06 DIAGNOSIS — Z880 Allergy status to penicillin: Secondary | ICD-10-CM

## 2017-11-06 DIAGNOSIS — W1839XA Other fall on same level, initial encounter: Secondary | ICD-10-CM

## 2017-11-06 DIAGNOSIS — I6523 Occlusion and stenosis of bilateral carotid arteries: Secondary | ICD-10-CM

## 2017-11-06 DIAGNOSIS — F17220 Nicotine dependence, chewing tobacco, uncomplicated: Secondary | ICD-10-CM

## 2017-11-06 DIAGNOSIS — Z951 Presence of aortocoronary bypass graft: Secondary | ICD-10-CM

## 2017-11-06 DIAGNOSIS — E1151 Type 2 diabetes mellitus with diabetic peripheral angiopathy without gangrene: Secondary | ICD-10-CM

## 2017-11-06 DIAGNOSIS — I482 Chronic atrial fibrillation: Secondary | ICD-10-CM

## 2017-11-06 DIAGNOSIS — I2510 Atherosclerotic heart disease of native coronary artery without angina pectoris: Secondary | ICD-10-CM

## 2017-11-06 DIAGNOSIS — S82441A Displaced spiral fracture of shaft of right fibula, initial encounter for closed fracture: Secondary | ICD-10-CM

## 2017-11-06 DIAGNOSIS — Z79899 Other long term (current) drug therapy: Secondary | ICD-10-CM

## 2017-11-06 DIAGNOSIS — Y93H2 Activity, gardening and landscaping: Secondary | ICD-10-CM

## 2017-11-06 DIAGNOSIS — I110 Hypertensive heart disease with heart failure: Secondary | ICD-10-CM

## 2017-11-06 DIAGNOSIS — I5030 Unspecified diastolic (congestive) heart failure: Secondary | ICD-10-CM

## 2017-11-06 DIAGNOSIS — D696 Thrombocytopenia, unspecified: Secondary | ICD-10-CM

## 2017-11-06 DIAGNOSIS — Z9862 Peripheral vascular angioplasty status: Secondary | ICD-10-CM

## 2017-11-06 DIAGNOSIS — Z7984 Long term (current) use of oral hypoglycemic drugs: Secondary | ICD-10-CM

## 2017-11-06 DIAGNOSIS — Z7902 Long term (current) use of antithrombotics/antiplatelets: Secondary | ICD-10-CM

## 2017-11-06 DIAGNOSIS — R791 Abnormal coagulation profile: Secondary | ICD-10-CM

## 2017-11-06 DIAGNOSIS — S82241A Displaced spiral fracture of shaft of right tibia, initial encounter for closed fracture: Principal | ICD-10-CM

## 2017-11-06 DIAGNOSIS — E1142 Type 2 diabetes mellitus with diabetic polyneuropathy: Secondary | ICD-10-CM

## 2017-11-06 HISTORY — PX: TIBIA IM NAIL INSERTION: SHX2516

## 2017-11-06 LAB — SURGICAL PCR SCREEN
MRSA, PCR: NEGATIVE
Staphylococcus aureus: NEGATIVE

## 2017-11-06 LAB — GLUCOSE, CAPILLARY
GLUCOSE-CAPILLARY: 77 mg/dL (ref 65–99)
GLUCOSE-CAPILLARY: 279 mg/dL — AB (ref 65–99)
GLUCOSE-CAPILLARY: 128 mg/dL — AB (ref 65–99)
Glucose-Capillary: 86 mg/dL (ref 65–99)

## 2017-11-06 LAB — CBC
HCT: 35.9 % — ABNORMAL LOW (ref 39.0–52.0)
Hemoglobin: 12.2 g/dL — ABNORMAL LOW (ref 13.0–17.0)
MCH: 33 pg (ref 26.0–34.0)
MCHC: 34 g/dL (ref 30.0–36.0)
MCV: 97 fL (ref 78.0–100.0)
PLATELETS: 82 10*3/uL — AB (ref 150–400)
RBC: 3.7 MIL/uL — AB (ref 4.22–5.81)
RDW: 13.1 % (ref 11.5–15.5)
WBC: 6 10*3/uL (ref 4.0–10.5)

## 2017-11-06 LAB — BASIC METABOLIC PANEL
Anion gap: 5 (ref 5–15)
BUN: 22 mg/dL — AB (ref 6–20)
CO2: 25 mmol/L (ref 22–32)
Calcium: 8.8 mg/dL — ABNORMAL LOW (ref 8.9–10.3)
Chloride: 112 mmol/L — ABNORMAL HIGH (ref 101–111)
Creatinine, Ser: 1.12 mg/dL (ref 0.61–1.24)
GFR calc Af Amer: >60 mL/min (ref >60)
GFR, EST NON AFRICAN AMERICAN: 59 mL/min — AB (ref >60)
GLUCOSE: 73 mg/dL (ref 65–99)
POTASSIUM: 4.2 mmol/L (ref 3.5–5.1)
Sodium: 142 mmol/L (ref 135–145)

## 2017-11-06 LAB — PROTIME-INR
INR: 1.71
Prothrombin Time: 19.9 seconds — ABNORMAL HIGH (ref 11.4–15.2)

## 2017-11-06 SURGERY — INSERTION, INTRAMEDULLARY ROD, TIBIA
Anesthesia: General | Laterality: Right

## 2017-11-06 MED ORDER — SUGAMMADEX SODIUM 200 MG/2ML IV SOLN
INTRAVENOUS | Status: AC
  Filled 2017-11-06: qty 2

## 2017-11-06 MED ORDER — SUGAMMADEX SODIUM 200 MG/2ML IV SOLN
INTRAVENOUS | Status: DC | PRN
  Administered 2017-11-06: 200 mg via INTRAVENOUS

## 2017-11-06 MED ORDER — 0.9 % SODIUM CHLORIDE (POUR BTL) OPTIME
TOPICAL | Status: DC | PRN
  Administered 2017-11-06: 1000 mL

## 2017-11-06 MED ORDER — VANCOMYCIN HCL 1000 MG IV SOLR
INTRAVENOUS | Status: AC
  Filled 2017-11-06: qty 1000

## 2017-11-06 MED ORDER — ONDANSETRON HCL 4 MG/2ML IJ SOLN
INTRAMUSCULAR | Status: AC
  Filled 2017-11-06: qty 4

## 2017-11-06 MED ORDER — PHENYLEPHRINE 40 MCG/ML (10ML) SYRINGE FOR IV PUSH (FOR BLOOD PRESSURE SUPPORT)
PREFILLED_SYRINGE | INTRAVENOUS | Status: AC
  Filled 2017-11-06: qty 10

## 2017-11-06 MED ORDER — DEXAMETHASONE SODIUM PHOSPHATE 10 MG/ML IJ SOLN
INTRAMUSCULAR | Status: DC | PRN
  Administered 2017-11-06: 10 mg via INTRAVENOUS

## 2017-11-06 MED ORDER — FENTANYL CITRATE (PF) 100 MCG/2ML IJ SOLN
INTRAMUSCULAR | Status: AC
  Filled 2017-11-06: qty 2

## 2017-11-06 MED ORDER — PHENYLEPHRINE HCL 10 MG/ML IJ SOLN
INTRAMUSCULAR | Status: DC | PRN
  Administered 2017-11-06: 80 ug via INTRAVENOUS

## 2017-11-06 MED ORDER — BACITRACIN 500 UNIT/GM EX OINT
TOPICAL_OINTMENT | CUTANEOUS | Status: DC | PRN
  Administered 2017-11-06: 1 via TOPICAL

## 2017-11-06 MED ORDER — FENTANYL CITRATE (PF) 250 MCG/5ML IJ SOLN
INTRAMUSCULAR | Status: AC
  Filled 2017-11-06: qty 5

## 2017-11-06 MED ORDER — EPHEDRINE SULFATE 50 MG/ML IJ SOLN
INTRAMUSCULAR | Status: AC
  Filled 2017-11-06: qty 1

## 2017-11-06 MED ORDER — MEPERIDINE HCL 50 MG/ML IJ SOLN: 6.2500 mg | INTRAMUSCULAR | Status: DC | PRN

## 2017-11-06 MED ORDER — LACTATED RINGERS IV SOLN
INTRAVENOUS | Status: DC
  Administered 2017-11-06 (×2): via INTRAVENOUS

## 2017-11-06 MED ORDER — FENTANYL CITRATE (PF) 100 MCG/2ML IJ SOLN
INTRAMUSCULAR | Status: DC | PRN
  Administered 2017-11-06: 100 ug via INTRAVENOUS
  Administered 2017-11-06: 50 ug via INTRAVENOUS

## 2017-11-06 MED ORDER — DEXTROSE 5 % IV SOLN
INTRAVENOUS | Status: DC | PRN
  Administered 2017-11-06: 25 ug/min via INTRAVENOUS

## 2017-11-06 MED ORDER — VANCOMYCIN HCL POWD
Status: DC | PRN
  Administered 2017-11-06: 1000 mg via TOPICAL

## 2017-11-06 MED ORDER — DEXAMETHASONE SODIUM PHOSPHATE 10 MG/ML IJ SOLN
INTRAMUSCULAR | Status: AC
  Filled 2017-11-06: qty 2

## 2017-11-06 MED ORDER — SODIUM CHLORIDE 0.9 % IJ SOLN
INTRAMUSCULAR | Status: AC
  Filled 2017-11-06: qty 10

## 2017-11-06 MED ORDER — CEFAZOLIN SODIUM-DEXTROSE 2-4 GM/100ML-% IV SOLN
2.0000 g | Freq: Three times a day (TID) | INTRAVENOUS | Status: AC
  Administered 2017-11-06 – 2017-11-07 (×3): 2 g via INTRAVENOUS
  Filled 2017-11-06 (×3): qty 100

## 2017-11-06 MED ORDER — LACTATED RINGERS IV SOLN
INTRAVENOUS | Status: DC | PRN
  Administered 2017-11-06: 09:00:00 via INTRAVENOUS

## 2017-11-06 MED ORDER — HYDROMORPHONE HCL 2 MG/ML IJ SOLN
0.5000 mg | INTRAMUSCULAR | Status: DC | PRN
  Administered 2017-11-06: 0.5 mg via INTRAVENOUS
  Filled 2017-11-06: qty 1

## 2017-11-06 MED ORDER — LIDOCAINE 2% (20 MG/ML) 5 ML SYRINGE
INTRAMUSCULAR | Status: AC
  Filled 2017-11-06: qty 10

## 2017-11-06 MED ORDER — PROPOFOL 10 MG/ML IV BOLUS
INTRAVENOUS | Status: DC | PRN
  Administered 2017-11-06: 100 mg via INTRAVENOUS

## 2017-11-06 MED ORDER — CEFAZOLIN SODIUM-DEXTROSE 2-4 GM/100ML-% IV SOLN
2.0000 g | INTRAVENOUS | Status: AC
  Administered 2017-11-06: 2 g via INTRAVENOUS
  Filled 2017-11-06 (×2): qty 100

## 2017-11-06 MED ORDER — LIDOCAINE HCL (CARDIAC) PF 100 MG/5ML IV SOSY
PREFILLED_SYRINGE | INTRAVENOUS | Status: DC | PRN
  Administered 2017-11-06: 50 mg via INTRAVENOUS

## 2017-11-06 MED ORDER — EPHEDRINE SULFATE 50 MG/ML IJ SOLN
INTRAMUSCULAR | Status: DC | PRN
  Administered 2017-11-06: 5 mg via INTRAVENOUS

## 2017-11-06 MED ORDER — BACITRACIN ZINC 500 UNIT/GM EX OINT
TOPICAL_OINTMENT | CUTANEOUS | Status: AC
  Filled 2017-11-06: qty 28.35

## 2017-11-06 MED ORDER — ROCURONIUM BROMIDE 50 MG/5ML IV SOLN
INTRAVENOUS | Status: AC
  Filled 2017-11-06: qty 1

## 2017-11-06 MED ORDER — PROPOFOL 10 MG/ML IV BOLUS
INTRAVENOUS | Status: AC
  Filled 2017-11-06: qty 20

## 2017-11-06 MED ORDER — ROCURONIUM BROMIDE 100 MG/10ML IV SOLN
INTRAVENOUS | Status: DC | PRN
  Administered 2017-11-06: 50 mg via INTRAVENOUS

## 2017-11-06 MED ORDER — FENTANYL CITRATE (PF) 100 MCG/2ML IJ SOLN
25.0000 ug | INTRAMUSCULAR | Status: DC | PRN
  Administered 2017-11-06: 50 ug via INTRAVENOUS

## 2017-11-06 MED FILL — Vancomycin HCl For IV Soln 1 GM (Base Equivalent): INTRAVENOUS | Qty: 1000 | Status: AC

## 2017-11-06 SURGICAL SUPPLY — 53 items
BANDAGE ACE 4X5 VEL STRL LF (GAUZE/BANDAGES/DRESSINGS) ×3 IMPLANT
BANDAGE ACE 6X5 VEL STRL LF (GAUZE/BANDAGES/DRESSINGS) ×3 IMPLANT
BIT DRILL CALIBRATED 4.2 (BIT) ×1 IMPLANT
BIT DRILL SHORT 4.2 (BIT) ×2 IMPLANT
BLADE SURG 10 STRL SS (BLADE) ×6 IMPLANT
BNDG COHESIVE 4X5 TAN STRL (GAUZE/BANDAGES/DRESSINGS) ×3 IMPLANT
BNDG GAUZE ELAST 4 BULKY (GAUZE/BANDAGES/DRESSINGS) ×3 IMPLANT
BOOT STEPPER DURA MED (SOFTGOODS) ×3 IMPLANT
BRUSH SCRUB SURG 4.25 DISP (MISCELLANEOUS) ×6 IMPLANT
CHLORAPREP W/TINT 26ML (MISCELLANEOUS) ×3 IMPLANT
COVER SURGICAL LIGHT HANDLE (MISCELLANEOUS) ×6 IMPLANT
DRAPE C-ARM 42X72 X-RAY (DRAPES) ×3 IMPLANT
DRAPE C-ARMOR (DRAPES) ×3 IMPLANT
DRAPE HALF SHEET 40X57 (DRAPES) ×6 IMPLANT
DRAPE IMP U-DRAPE 54X76 (DRAPES) ×6 IMPLANT
DRAPE INCISE IOBAN 66X45 STRL (DRAPES) IMPLANT
DRAPE ORTHO SPLIT 77X108 STRL (DRAPES) ×4
DRAPE SURG ORHT 6 SPLT 77X108 (DRAPES) ×2 IMPLANT
DRAPE U-SHAPE 47X51 STRL (DRAPES) ×3 IMPLANT
DRILL BIT CALIBRATED 4.2 (BIT) ×3
DRILL BIT SHORT 4.2 (BIT) ×4
DRSG ADAPTIC 3X8 NADH LF (GAUZE/BANDAGES/DRESSINGS) ×3 IMPLANT
DRSG EMULSION OIL 3X3 NADH (GAUZE/BANDAGES/DRESSINGS) ×3 IMPLANT
ELECT REM PT RETURN 9FT ADLT (ELECTROSURGICAL) ×3
ELECTRODE REM PT RTRN 9FT ADLT (ELECTROSURGICAL) ×1 IMPLANT
GAUZE SPONGE 4X4 12PLY STRL (GAUZE/BANDAGES/DRESSINGS) ×3 IMPLANT
GAUZE SPONGE 4X4 12PLY STRL LF (GAUZE/BANDAGES/DRESSINGS) ×3 IMPLANT
GLOVE BIO SURGEON STRL SZ7.5 (GLOVE) ×12 IMPLANT
GLOVE BIOGEL PI IND STRL 7.5 (GLOVE) ×1 IMPLANT
GLOVE BIOGEL PI INDICATOR 7.5 (GLOVE) ×2
GOWN STRL REUS W/ TWL LRG LVL3 (GOWN DISPOSABLE) ×2 IMPLANT
GOWN STRL REUS W/TWL LRG LVL3 (GOWN DISPOSABLE) ×4
GUIDEWIRE 3.2X400 (WIRE) ×6 IMPLANT
KIT BASIN OR (CUSTOM PROCEDURE TRAY) ×3 IMPLANT
KIT TURNOVER KIT B (KITS) ×3 IMPLANT
NAIL TIBIAL W/PROX BEND 10X345 (Nail) ×3 IMPLANT
PACK TOTAL JOINT (CUSTOM PROCEDURE TRAY) ×3 IMPLANT
PAD ARMBOARD 7.5X6 YLW CONV (MISCELLANEOUS) ×6 IMPLANT
REAMER ROD DEEP FLUTE 2.5X950 (INSTRUMENTS) ×3 IMPLANT
SCREW LOCK STAR 5X32 (Screw) ×3 IMPLANT
SCREW LOCK STAR 5X36 (Screw) ×3 IMPLANT
SCREW LOCK STAR 5X40 (Screw) ×3 IMPLANT
SCREW LOCK STAR 5X42 (Screw) ×3 IMPLANT
SCREW LOCK STAR 5X70 (Screw) ×3 IMPLANT
STAPLER VISISTAT 35W (STAPLE) ×3 IMPLANT
SUT MNCRL AB 3-0 PS2 18 (SUTURE) ×3 IMPLANT
SUT VIC AB 0 CT1 27 (SUTURE)
SUT VIC AB 0 CT1 27XBRD ANBCTR (SUTURE) IMPLANT
SUT VIC AB 2-0 CT1 27 (SUTURE)
SUT VIC AB 2-0 CT1 TAPERPNT 27 (SUTURE) IMPLANT
TOWEL OR 17X24 6PK STRL BLUE (TOWEL DISPOSABLE) ×3 IMPLANT
TOWEL OR 17X26 10 PK STRL BLUE (TOWEL DISPOSABLE) ×6 IMPLANT
YANKAUER SUCT BULB TIP NO VENT (SUCTIONS) IMPLANT

## 2017-11-06 NOTE — Consult Note (Signed)
Orthopaedic Trauma Service (OTS) Consult   Patient ID: Jimmy Lawrence MRN: [id number] DOB/AGE: December 24, 1932 84 y.o.  Reason for Consult:Right tibia fracture Referring Physician: Dr. Burnard Bunting, MD [facility]  HPI: Jimmy Lawrence is an 84 y.o. male who is being seen in consultation at the request of Dr. August Saucer for evaluation of right tibia fracture.  The patient was in his usual state of health when he sustained an injury while he was mowing the lawn.  He was changing out the blades when he fell backwards and had immediate pain and deformity to his leg.  He was unable to weight-bear and was brought to the emergency room where a distal tibial shaft fracture was identified.  He was placed in a long-leg splint and was admitted for pain control and surgery.  Currently he rates his pain is well controlled if he does not move the leg.  He denies any other injuries to any of the lower extremities.  He does note numbness and tingling in bilateral lower extremities mostly at night.  This improves when his feet are in the a dependent position.  He has a history of coronary artery disease and atrial fibrillation for which she is on Coumadin.  He also has a history of peripheral artery disease as well as type 2 diabetes that is non-insulin-dependent.  He lives with his wife.  He ambulates with the use of a walker and cane.  He is still very independent and active around the house.  He does his own yard work.  Past Medical History:  Diagnosis Date  . CAD (coronary artery disease)   . Carotid artery stenosis, asymptomatic, bilateral    s/p CEA 1999  . PAD (peripheral artery disease) (HCC)   . Peripheral neuropathy   . SAH (subarachnoid hemorrhage) (HCC)    post CEA in 1999  . T2DM (type 2 diabetes mellitus) (HCC)     Past Surgical History:  Procedure Laterality Date  . CAROTID ENDARTERECTOMY  1999  . CHOLECYSTECTOMY    . CORONARY ARTERY BYPASS GRAFT    . HERNIA REPAIR     abdominal x2     History reviewed. No pertinent family history.  Social History:  reports that he has quit smoking. His smokeless tobacco use includes chew. He reports that he drank alcohol. He reports that he does not use drugs.  Allergies:  Allergies  Allergen Reactions  . Penicillins Hives    Has patient had a PCN reaction causing immediate rash, facial/tongue/throat swelling, SOB or lightheadedness with hypotension: No Has patient had a PCN reaction causing severe rash involving mucus membranes or skin necrosis: No Has patient had a PCN reaction that required hospitalization: No Has patient had a PCN reaction occurring within the last 10 years: No If all of the above answers are "NO", then may proceed with Cephalosporin use.    Medications:  No current facility-administered medications on file prior to encounter.    Current Outpatient Medications on File Prior to Encounter  Medication Sig Dispense Refill  . acetaminophen (TYLENOL) 500 MG tablet Take 1,000 mg by mouth every 6 (six) hours as needed for headache (pain).    Marland Kitchen albuterol (PROVENTIL HFA;VENTOLIN HFA) 108 (90 Base) MCG/ACT inhaler Inhale 1 puff into the lungs every 6 (six) hours as needed for wheezing or shortness of breath.    . clopidogrel (PLAVIX) 75 MG tablet Take 75 mg by mouth daily with breakfast.  11  . febuxostat (ULORIC) 40 MG tablet Take 40 mg by  mouth daily with supper.    . fluticasone (FLONASE) 50 MCG/ACT nasal spray Place 2 sprays into the nose 2 (two) times daily as needed (seasonal allergies).     . furosemide (LASIX) 40 MG tablet Take 40 mg by mouth daily as needed for fluid or edema (weight gain of 3 lbs over a couple days).     Marland Kitchen glipiZIDE (GLUCOTROL XL) 10 MG 24 hr tablet Take 10 mg by mouth daily with breakfast.    . loperamide (IMODIUM) 2 MG capsule Take 2 mg by mouth every 4 (four) hours as needed for diarrhea or loose stools.     Marland Kitchen loratadine (CLARITIN) 10 MG tablet Take 10 mg by mouth daily as needed (seasonal  allergies).     Marland Kitchen lovastatin (MEVACOR) 40 MG tablet Take 40 mg by mouth daily with supper.   3  . magnesium oxide (MAG-OX) 400 MG tablet Take 400 mg by mouth daily with breakfast.  2  . meclizine (ANTIVERT) 25 MG tablet Take 25 mg by mouth 3 (three) times daily as needed for dizziness.     . metFORMIN (GLUCOPHAGE-XR) 500 MG 24 hr tablet Take 500 mg by mouth daily with supper.   2  . metoprolol tartrate (LOPRESSOR) 25 MG tablet Take 25 mg by mouth 2 (two) times daily with a meal.  2  . nitroGLYCERIN (NITROSTAT) 0.4 MG SL tablet Place 0.4 mg under the tongue every 5 (five) minutes as needed for chest pain.     . Omega-3 Fatty Acids (FISH OIL) 1200 MG CAPS Take 1,200 mg by mouth daily with supper.    . potassium chloride SA (K-DUR,KLOR-CON) 20 MEQ tablet Take 20 mEq by mouth daily with breakfast.   0  . pregabalin (LYRICA) 150 MG capsule Take 150 mg by mouth 2 (two) times daily with a meal.    . quinapril (ACCUPRIL) 5 MG tablet Take 5 mg by mouth daily with breakfast.   3  . warfarin (COUMADIN) 5 MG tablet Take 2.5-5 mg by mouth See admin instructions. Take 1/2 tablet (2.5 mg) by mouth on Monday, Wednesday, Friday with supper; take 1 tablet (5 mg) on Sunday, Tuesday, Thursday, Saturday with supper      ROS: Constitutional: No fever or chills Vision: No changes in vision ENT: No difficulty swallowing CV: No chest pain Pulm: No SOB or wheezing GI: No nausea or vomiting GU: No urgency or inability to hold urine Skin: No poor wound healing Neurologic: Positive numbness and tingling as noted in the HPI above Psychiatric: No depression or anxiety Heme: No bruising Allergic: No reaction to medications or food   Exam: Blood pressure 120/74, pulse 92, temperature 98.3 F (36.8 C), temperature source Oral, resp. rate 16, height 5\' 7"  (1.702 m), weight 83.9 kg (185 lb), SpO2 97 %. General: No acute distress Orientation: Awake alert and oriented x3 Mood and Affect: Cooperative and pleasant Gait:  Unable to assess due to his fracture Coordination and balance: Within normal limits  Right lower extremity:: Long-leg splint is in place.  It is clean dry and intact.  He has positive EHL and FHL.  He has sensation that he endorses to the dorsum and plantar aspect of his foot.  He is warm well perfused.  Due to the splint that is in place I was unable to palpate his DP or PT pulse.  His compartments are soft and compressible.  No pain at the knee or hip.  Left lower extremity: He has some chronic changes to  his lower extremities consistent with peripheral artery disease.  Has warm well-perfused feet.  He has intact ankle dorsiflexion plantarflexion full passive range of motion of the knee and hip without pain or discomfort.  He endorses sensation to the dorsum and plantar aspect of his foot.  I was unable to palpate DP or PT pulses.  Medical Decision Making: Imaging: X-rays of his right tibia and ankle are reviewed which shows a distal tibial shaft fracture spiral oblique.  With moderate displacement.  No intra-articular extension into the ankle.  Labs:  Results for orders placed or performed during the hospital encounter of 11/05/17 (from the past 24 hour(s))  CBC     Status: Abnormal   Collection Time: 11/05/17  5:15 PM  Result Value Ref Range   WBC 6.2 4.0 - 10.5 K/uL   RBC 4.08 (L) 4.22 - 5.81 MIL/uL   Hemoglobin 13.4 13.0 - 17.0 g/dL   HCT 47.840.2 29.539.0 - 62.152.0 %   MCV 98.5 78.0 - 100.0 fL   MCH 32.8 26.0 - 34.0 pg   MCHC 33.3 30.0 - 36.0 g/dL   RDW 30.813.3 65.711.5 - 84.615.5 %   Platelets 110 (L) 150 - 400 K/uL  Comprehensive metabolic panel     Status: Abnormal   Collection Time: 11/05/17  5:15 PM  Result Value Ref Range   Sodium 145 135 - 145 mmol/L   Potassium 4.4 3.5 - 5.1 mmol/L   Chloride 111 101 - 111 mmol/L   CO2 26 22 - 32 mmol/L   Glucose, Bld 109 (H) 65 - 99 mg/dL   BUN 22 (H) 6 - 20 mg/dL   Creatinine, Ser 9.621.23 0.61 - 1.24 mg/dL   Calcium 9.4 8.9 - 95.210.3 mg/dL   Total Protein 6.9  6.5 - 8.1 g/dL   Albumin 4.3 3.5 - 5.0 g/dL   AST 35 15 - 41 U/L   ALT 21 17 - 63 U/L   Alkaline Phosphatase 87 38 - 126 U/L   Total Bilirubin 1.4 (H) 0.3 - 1.2 mg/dL   GFR calc non Af Amer 52 (L) >60 mL/min   GFR calc Af Amer >60 >60 mL/min   Anion gap 8 5 - 15  Protime-INR     Status: Abnormal   Collection Time: 11/05/17  7:20 PM  Result Value Ref Range   Prothrombin Time 20.6 (H) 11.4 - 15.2 seconds   INR 1.78   CBG monitoring, ED     Status: None   Collection Time: 11/05/17  8:34 PM  Result Value Ref Range   Glucose-Capillary 69 65 - 99 mg/dL   Comment 1 Notify RN    Comment 2 Document in Chart   Surgical pcr screen     Status: None   Collection Time: 11/05/17 10:31 PM  Result Value Ref Range   MRSA, PCR NEGATIVE NEGATIVE   Staphylococcus aureus NEGATIVE NEGATIVE  Glucose, capillary     Status: Abnormal   Collection Time: 11/05/17 11:06 PM  Result Value Ref Range   Glucose-Capillary 138 (H) 65 - 99 mg/dL  Basic metabolic panel     Status: Abnormal   Collection Time: 11/06/17  5:31 AM  Result Value Ref Range   Sodium 142 135 - 145 mmol/L   Potassium 4.2 3.5 - 5.1 mmol/L   Chloride 112 (H) 101 - 111 mmol/L   CO2 25 22 - 32 mmol/L   Glucose, Bld 73 65 - 99 mg/dL   BUN 22 (H) 6 - 20 mg/dL  Creatinine, Ser 1.12 0.61 - 1.24 mg/dL   Calcium 8.8 (L) 8.9 - 10.3 mg/dL   GFR calc non Af Amer 59 (L) >60 mL/min   GFR calc Af Amer >60 >60 mL/min   Anion gap 5 5 - 15  Protime-INR     Status: Abnormal   Collection Time: 11/06/17  5:31 AM  Result Value Ref Range   Prothrombin Time 19.9 (H) 11.4 - 15.2 seconds   INR 1.71   CBC     Status: Abnormal   Collection Time: 11/06/17  5:31 AM  Result Value Ref Range   WBC 6.0 4.0 - 10.5 K/uL   RBC 3.70 (L) 4.22 - 5.81 MIL/uL   Hemoglobin 12.2 (L) 13.0 - 17.0 g/dL   HCT 40.9 (L) 81.1 - 91.4 %   MCV 97.0 78.0 - 100.0 fL   MCH 33.0 26.0 - 34.0 pg   MCHC 34.0 30.0 - 36.0 g/dL   RDW 78.2 95.6 - 21.3 %   Platelets 82 (L) 150 - 400 K/uL    Medical history and chart was reviewed  Assessment/Plan: 84 year old male with a history of coronary artery disease, peripheral artery disease, atrial fibrillation on Coumadin and peripheral neuropathy with type II non-insulin-dependent diabetes with a right distal tibial shaft fracture.  I discussed proceeding with surgical fixation.  I feel that the risks and benefits would lean towards fixation.  I feel like immobilization and nonweightbearing for the patient would be a significant issue.  I feel that intramedullary nailing will allow him to ambulate more rapidly and allow him to recover and return to his previous ambulatory status quicker and more reliably.  Risks were discussed with the patient. Risks discussed included bleeding requiring blood transfusion, bleeding causing a hematoma, infection, malunion, nonunion, damage to surrounding nerves and blood vessels, pain, hardware prominence or irritation, hardware failure, stiffness, post-traumatic arthritis, DVT/PE, compartment syndrome, and even death. The patient and his son agreed to proceed with surgery and consent was obtained.  His INR was 1.7 this morning which was appropriate for proceeding with surgical fixation.   Roby Lofts, MD [facility] [phone] (phone)

## 2017-11-06 NOTE — Anesthesia Procedure Notes (Signed)
Procedure Name: Intubation Date/Time: 11/06/2017 9:43 AM Performed by: Neldon Newport, CRNA Pre-anesthesia Checklist: Timeout performed, Patient being monitored, Suction available, Emergency Drugs available and Patient identified Patient Re-evaluated:Patient Re-evaluated prior to induction Oxygen Delivery Method: Circle system utilized Preoxygenation: Pre-oxygenation with 100% oxygen Induction Type: IV induction Ventilation: Mask ventilation without difficulty Laryngoscope Size: Mac and 4 Grade View: Grade I Tube type: Oral Tube size: 7.5 mm Number of attempts: 1 Placement Confirmation: breath sounds checked- equal and bilateral,  positive ETCO2 and ETT inserted through vocal cords under direct vision Secured at: 23 cm Tube secured with: Tape Dental Injury: Teeth and Oropharynx as per pre-operative assessment

## 2017-11-06 NOTE — Progress Notes (Signed)
PT Cancellation Note  Patient Details Name: Jimmy MaceKenneth Lawrence MRN: [id number] DOB: 07-May-1933   Cancelled Treatment:    Reason Eval/Treat Not Completed: Patient not medically ready. Plan is for pt to go to OR today for surgical management of LE fracture per ortho note on 6/20. No current WB'ing orders in place. PT will hold evaluation until after surgery and await updated activity/WB'ing orders.    Alessandra BevelsJennifer M Jaisa Defino 11/06/2017, 7:56 AM

## 2017-11-06 NOTE — Anesthesia Preprocedure Evaluation (Addendum)
Anesthesia Evaluation  Patient identified by MRN, date of birth, ID band Patient awake    Reviewed: Allergy & Precautions, H&P , NPO status , Patient's Chart, lab work & pertinent test results, reviewed documented beta blocker date and time   Airway Mallampati: II  TM Distance: >3 FB Neck ROM: full    Dental no notable dental hx. (+) Edentulous Lower, Poor Dentition, Dental Advidsory Given   Pulmonary neg pulmonary ROS, former smoker,    Pulmonary exam normal breath sounds clear to auscultation       Cardiovascular Exercise Tolerance: Good hypertension, Pt. on medications and Pt. on home beta blockers + CAD and + Peripheral Vascular Disease  + dysrhythmias Atrial Fibrillation  Rhythm:regular Rate:Normal     Neuro/Psych  Neuromuscular disease    GI/Hepatic   Endo/Other  negative endocrine ROSdiabetes, Oral Hypoglycemic Agents  Renal/GU   negative genitourinary   Musculoskeletal   Abdominal   Peds  Hematology   Anesthesia Other Findings   Reproductive/Obstetrics negative OB ROS                            Anesthesia Physical Anesthesia Plan  ASA: III  Anesthesia Plan: General   Post-op Pain Management:    Induction: Intravenous  PONV Risk Score and Plan: 1 and Ondansetron and Treatment may vary due to age or medical condition  Airway Management Planned: Oral ETT  Additional Equipment:   Intra-op Plan:   Post-operative Plan: Extubation in OR  Informed Consent: I have reviewed the patients History and Physical, chart, labs and discussed the procedure including the risks, benefits and alternatives for the proposed anesthesia with the patient or authorized representative who has indicated his/her understanding and acceptance.   Dental Advisory Given  Plan Discussed with: CRNA, Anesthesiologist and Surgeon  Anesthesia Plan Comments: (  )        Anesthesia Quick  Evaluation

## 2017-11-06 NOTE — Op Note (Signed)
OrthopaedicSurgeryOperativeNote (ZOX:[id number]) Date of Surgery: 11/06/2017  Admit Date: 11/05/2017   Diagnoses: Pre-Op Diagnoses: Right tibial shaft fracture   Post-Op Diagnosis: Same  Procedures: CPT 27759-Intramedullary nailing of right tibia  Surgeons: Primary: Roby Lofts, MD   Location:[facility] OR ROOM 05   AnesthesiaGeneral   Antibiotics:Ancef 2g preop   Tourniquettime:None  EstimatedBloodLoss:100 mL  Complications:None  Specimens:None  Implants: Implant Name Type Inv. Item Serial No. Manufacturer Lot No. LRB No. Used Action  NAIL TIBIAL W/PROX BEND 10X345 - [id number] Nail NAIL TIBIAL W/PROX BEND 10X345  [employer] [id number] Right 1 Implanted  SCREW LOCK STAR 5X40 - [id number] Screw SCREW LOCK STAR 5X40  [employer]  Right 1 Implanted  SCREW LOCK STAR 5X42 - [id number] Screw SCREW LOCK STAR 5X42  [employer]  Right 1 Implanted  SCREW LOCK STAR 5X70 - [id number] Screw SCREW LOCK STAR 5X70  [employer]  Right 1 Implanted  SCREW LOCK STAR 5X36 - [id number] Screw SCREW LOCK STAR 5X36  [employer]  Right 1 Implanted    IndicationsforSurgery: 84 year old male with a history of coronary artery disease, peripheral artery disease, atrial fibrillation on Coumadin and peripheral neuropathy with type II non-insulin-dependent diabetes with a right distal tibial shaft fracture. I discussed proceeding with surgical fixation.  I feel that the risks and benefits would lean towards fixation.  I feel like immobilization and nonweightbearing for the patient would be a significant issue.  I feel that intramedullary nailing will allow him to ambulate more rapidly and allow him to recover and return to his previous ambulatory status quicker and more reliably.  Risks were discussed with the patient. Risks discussed included bleeding requiring blood transfusion, bleeding causing a hematoma, infection, malunion, nonunion, damage to surrounding nerves and blood vessels,  pain, hardware prominence or irritation, hardware failure, stiffness, post-traumatic arthritis, DVT/PE, compartment syndrome, and even death. The patient and his son agreed to proceed with surgery and consent was obtained.  Operative Findings: Intramedullary nailing of right tibia fracture using [employer] EX nail size 345x34mm  Procedure: The patient was identified in the preoperative holding area. Consent was confirmed with the patient and their family and all questions were answered. The operative extremity was marked after confirmation with the patient. They were then brought back to the operating room by our anesthesia colleagues.  The patient was placed under general anesthetic and then carefully transferred over to a radiolucent flat top table.  Here a bump was placed under the operative hip and a bone foam was placed under the operative extremity.  The operative extremity was then prepped and draped in usual sterile fashion. A preoperative timeout was performed to verify the patient, the procedure, and the extremity. Preoperative antibiotics were dosed.  Fluoroscopic imaging was obtained to show the displacement of the fracture.   I made a lateral parapatellar incision carried down through skin and subcutaneous tissue just lateral to the patellar tendon.  I released a portion of the lateral retinaculum but stayed extra-articular outside the capsule.  I excised the anterior fat pad and then proceeded to place a guidepin under fluoroscopic imaging to confirm adequate placement in both the AP and lateral views.  I then advanced a wire into the proximal metaphysis of the tibia.  I then used an entry reamer to enter the canal.  I passed a bent ball-tipped guidewire down the center of the canal and was able to pass in the distal segment after performing a closed reduction maneuver.  I seated it down into the  physeal scar.  I then measured the length of the nail and I chose a 344 mm nail.  I then proceeded  to sequentially ream up from 8.5 mm to 11 mm.  I obtained excellent chatter and I chose to place an 10 mm nail.  I then placed nail across the fracture into the distal segment.  The fracture had excellent reduction after the nail was placed.  I seated the nail to where it was slightly buried at the lateral of the knee.  I then placed 2 distal interlocking screws from medial to lateral using perfect circle technique.  I placed a distal oblique interlocking screw as well.  I then used my proximal jig to place 2 proximal interlocking screws through percutaneous incisions.  I removed the jig and obtained final fluoroscopic imaging.  The incisions were copiously irrigated.  I closed the lateral parapatellar incision with 0 Vicryl, 2-0 Vicryl and 3-0 nylon.  The remainder the incisions were closed with 3-0 nylon. A sterile dressing was placed. The patient was then awoken from anesthesia and taken to the PACU in stable condition.   Post Op Plan/Instructions: Partial weightbearing to right lower extremity. Ancef postoperatively. Restart on coumadin on postoperative day 1.  I was present and performed the entire surgery.  Truitt MerleKevin Haddix, MD [facility]

## 2017-11-06 NOTE — Plan of Care (Signed)
  Problem: Education: Goal: Knowledge of General Education information will improve Outcome: Progressing   Problem: Clinical Measurements: Goal: Ability to maintain clinical measurements within normal limits will improve Outcome: Progressing   Problem: Pain Managment: Goal: General experience of comfort will improve Outcome: Progressing   

## 2017-11-06 NOTE — Progress Notes (Signed)
Orthopedic Tech Progress Note Patient Details:  Jimmy MaceKenneth Lawrence 04-20-1933 [id number]  Ortho Devices Type of Ortho Device: CAM walker Ortho Device/Splint Location: RLE Ortho Device/Splint Interventions: Ordered, Application   Post Interventions Patient Tolerated: Well Instructions Provided: Care of device   Saul FordyceJennifer C Abdinasir Spadafore 11/06/2017, 11:14 AM

## 2017-11-06 NOTE — Transfer of Care (Signed)
Immediate Anesthesia Transfer of Care Note  Patient: Jimmy MaceKenneth Pate  Procedure(s) Performed: INTRAMEDULLARY (IM) NAIL TIBIAL (Right )  Patient Location: PACU  Anesthesia Type:General  Level of Consciousness: awake, alert  and oriented  Airway & Oxygen Therapy: Patient Spontanous Breathing and Patient connected to nasal cannula oxygen  Post-op Assessment: Report given to RN, Post -op Vital signs reviewed and stable and Patient moving all extremities X 4  Post vital signs: Reviewed and stable  Last Vitals:  Vitals Value Taken Time  BP 134/81 11/06/2017 11:30 AM  Temp    Pulse 74 11/06/2017 11:31 AM  Resp 10 11/06/2017 11:31 AM  SpO2 99 % 11/06/2017 11:31 AM  Vitals shown include unvalidated device data.  Last Pain:  Vitals:   11/06/17 0314  TempSrc: Oral  PainSc:       Patients Stated Pain Goal: 2 (11/06/17 0124)  Complications: No apparent anesthesia complications

## 2017-11-06 NOTE — Anesthesia Postprocedure Evaluation (Signed)
Anesthesia Post Note  Patient: Trevor MaceKenneth [place]feuille  Procedure(s) Performed: INTRAMEDULLARY (IM) NAIL TIBIAL (Right )     Patient location during evaluation: PACU Anesthesia Type: General Level of consciousness: awake and alert Pain management: pain level controlled Vital Signs Assessment: post-procedure vital signs reviewed and stable Respiratory status: spontaneous breathing, nonlabored ventilation, respiratory function stable and patient connected to nasal cannula oxygen Cardiovascular status: blood pressure returned to baseline and stable Postop Assessment: no apparent nausea or vomiting Anesthetic complications: no    Last Vitals:  Vitals:   11/06/17 1245 11/06/17 1300  BP: (!) 142/73 (!) 147/88  Pulse: 78 77  Resp: (!) 8 12  Temp:  36.5 C  SpO2: 99% 95%    Last Pain:  Vitals:   11/06/17 1300  TempSrc:   PainSc: 3         RLE Motor Response: Responds to commands (11/06/17 1346) RLE Sensation: Numbness;Tingling (11/06/17 1346)      Brandin Stetzer

## 2017-11-06 NOTE — Progress Notes (Signed)
   Subjective: Mr Lin GivensJeffries was seen laying in his bed this morning. He states he's not sure what he tripped over when he fell yesterday. But ddi not land on anything or have anything land on him. He states he is feeling okay, but his pain medication is starting to wear off and his pain is starting to return; has otherwise been well controlled on the pain medication. He has been seen by surgery this AM and was told he will be going for surgery between 9:30 and 10. He has no other questions or concerns this morning.  Objective:  Vital signs in last 24 hours: Vitals:   11/05/17 2115 11/05/17 2130 11/05/17 2216 11/06/17 0314  BP: (!) 174/101 (!) 125/104 129/82 120/74  Pulse: (!) 46 77 73 92  Resp: 20 15 16 16   Temp:   98 F (36.7 C) 98.3 F (36.8 C)  TempSrc:   Oral Oral  SpO2: 97% 99% 97% 97%  Weight:      Height:       Physical Exam  Constitutional: He is oriented to person, place, and time. He appears well-developed and well-nourished. No distress.  Eyes: EOM are normal. Right eye exhibits no discharge. Left eye exhibits no discharge.  Cardiovascular: Normal rate, regular rhythm, normal heart sounds and intact distal pulses.  Pulmonary/Chest: Effort normal and breath sounds normal. No respiratory distress.  Abdominal: Soft. Bowel sounds are normal. He exhibits no distension. There is no tenderness.  Musculoskeletal:  RLE splinted and wrapped  Neurological: He is alert and oriented to person, place, and time.  Skin: Skin is warm and dry.   Assessment/Plan: 84 y.o male with CAD s/p CABG in 2004, HFpEF, CVD ([facility] 1999 [facility] and [facility]), PAD, DM, HTN, and Chronic A-Fib who presented to the ED with right leg pain.  Right Displaced Spiral Tib-Fib Fracture: Secondary to mechanical fall; patient denies any symptoms that would be concerning for cardiovascular or neurologic cause of his fall. > RLE in long-leg splint for stabilization  - Appreciate orthopedics  recommendations - Surgery this morning - Holding warfarin - Pain control with tylenol, oxy IR prn  Thrombocytopenia: Plt 82 on Am labs, noted to be 110 on admission. Baseline in the 80s-130s per care everywhere. - Will continue to monitor  T2DM, with peripheral neuropathy: On glipizide 10 mg QD, Metformin 500 mg QD at home. On Lyrica 150mg  BID for neuropathy. Episode of hypoglycemia to 69 in ED which was treated.  -  SSI-S wc  Chronic Atrial Fibrillation: CHA2DS2-VASc Score of 8. EKG on admission showed rate controlled A fib. He is asymptomatic. INR subtherapeutic at 1.78. -  Continue Metoprolol 25 mg BID  -  Hold Warfarin in anticipation for ORIF of the right LE tomorrow; due to high risk will need bridging  HFpEF: Echo (06/2014): EF 55%, LVH with hyperlipomatosis of IAS. No valvular abnormalities noted; R atrial dilation. On metoprolol 25mg  BID, quinapril 5mg  daily and furosemide 40mg  PRN at home.  - Continue Metoprolol 25 mg BID, Quinapril 5 mg  PAD Carotid Artery Stenosis CAD s/p CABG: No ischemic signs or symptoms.  - Continue Clopidogrel 75 mg QD, Lovastatin 40 mg QD, and Quinapril 5 mg QD  Diet: NPO, then CM/HH DVT ppx: sq heparin Code: FULL  Dispo: Anticipated discharge in approximately 2-3 day(s).   Beola CordMelvin, Deazia Lampi, MD 11/06/2017, 7:32 AM Pager: [phone]

## 2017-11-06 NOTE — Progress Notes (Signed)
OT Cancellation Note  Patient Details Name: Jimmy MaceKenneth Lawrence MRN: [id number] DOB: 1933-01-13   Cancelled Treatment:    Reason Eval/Treat Not Completed: Patient not medically ready.Plan is for pt to go to OR today for surgical management of LE fracture per ortho note on 6/20. No current WB'ing orders in place. OT will hold evaluation and await orders after surgery.   Alen BleacherBradsher, Lashya Passe P, MS, OTR/L 11/06/2017, 8:22 AM

## 2017-11-07 LAB — GLUCOSE, CAPILLARY
GLUCOSE-CAPILLARY: 169 mg/dL — AB (ref 65–99)
GLUCOSE-CAPILLARY: 115 mg/dL — AB (ref 65–99)
Glucose-Capillary: 140 mg/dL — ABNORMAL HIGH (ref 65–99)
Glucose-Capillary: 162 mg/dL — ABNORMAL HIGH (ref 65–99)

## 2017-11-07 MED ORDER — WARFARIN - PHARMACIST DOSING INPATIENT
Freq: Every day | Status: DC
  Administered 2017-11-08: 18:00:00

## 2017-11-07 MED ORDER — WARFARIN SODIUM 5 MG PO TABS
5.0000 mg | ORAL_TABLET | Freq: Once | ORAL | Status: AC
  Administered 2017-11-07: 5 mg via ORAL
  Filled 2017-11-07: qty 1

## 2017-11-07 NOTE — Progress Notes (Signed)
ANTICOAGULATION CONSULT NOTE - Initial Consult  Pharmacy Consult for Coumadin Indication: atrial fibrillation  Allergies  Allergen Reactions  . Penicillins Hives    Tolerated cefazolin in June 2019.  Has patient had a PCN reaction causing immediate rash, facial/tongue/throat swelling, SOB or lightheadedness with hypotension: No Has patient had a PCN reaction causing severe rash involving mucus membranes or skin necrosis: No Has patient had a PCN reaction that required hospitalization: No Has patient had a PCN reaction occurring within the last 10 years: No If all of the above answers are "NO", then may proceed with Cephalosporin use.    Patient Measurements: Height: 5\' 7"  (170.2 cm) Weight: 185 lb (83.9 kg) IBW/kg (Calculated) : 66.1  Vital Signs: Temp: 97.7 F (36.5 C) (06/22 0807) Temp Source: Oral (06/22 0807) BP: 143/130 (06/22 0807) Pulse Rate: 118 (06/22 0807)  Labs: Recent Labs    11/05/17 1715 11/05/17 1920 11/06/17 0531  HGB 13.4  --  12.2*  HCT 40.2  --  35.9*  PLT 110*  --  82*  LABPROT  --  20.6* 19.9*  INR  --  1.78 1.71  CREATININE 1.23  --  1.12    Estimated Creatinine Clearance: 51.7 mL/min (by C-G formula based on SCr of 1.12 mg/dL).   Medical History: Past Medical History:  Diagnosis Date  . CAD (coronary artery disease)   . Carotid artery stenosis, asymptomatic, bilateral    s/p CEA 1999  . PAD (peripheral artery disease) (HCC)   . Peripheral neuropathy   . SAH (subarachnoid hemorrhage) (HCC)    post CEA in 1999  . T2DM (type 2 diabetes mellitus) (HCC)    Assessment: On Coumadin 5mg  daily exc for 2.5mg  on MWF PTA for Afib. Held for ortho surgery. INR was 1.71 on 6/21. Now to restart on 6/22. Hgb 12.2, plts low at 82.    Goal of Therapy:  INR 2-3 Monitor platelets by anticoagulation protocol: Yes   Plan:  Restart Coumadin tonight at 5mg  PO x 1 Monitor daily INR, CBC, s/s of bleed  Somaya Grassi J 11/07/2017,10:50 AM

## 2017-11-07 NOTE — Progress Notes (Signed)
Subjective: 1 Day Post-Op Procedure(s) (LRB): INTRAMEDULLARY (IM) NAIL TIBIAL (Right) Patient reports pain as moderate.  Patient relates pre op pain secondary to peripheral neuropathy in affected leg/foot  Objective: Vital signs in last 24 hours: Temp:  [97.7 F (36.5 C)-98.3 F (36.8 C)] 97.7 F (36.5 C) (06/22 0807) Pulse Rate:  [66-118] 118 (06/22 0807) Resp:  [8-15] 12 (06/21 1354) BP: (109-147)/(61-130) 143/130 (06/22 0807) SpO2:  [95 %-100 %] 96 % (06/22 0807)  Intake/Output from previous day: 06/21 0701 - 06/22 0700 In: 1475.8 [I.V.:1175.8; IV Piggyback:300] Out: 820 [Urine:800; Blood:20] Intake/Output this shift: Total I/O In: -  Out: 260 [Urine:260]  Recent Labs    11/05/17 1715 11/06/17 0531  HGB 13.4 12.2*   Recent Labs    11/05/17 1715 11/06/17 0531  WBC 6.2 6.0  RBC 4.08* 3.70*  HCT 40.2 35.9*  PLT 110* 82*   Recent Labs    11/05/17 1715 11/06/17 0531  NA 145 142  K 4.4 4.2  CL 111 112*  CO2 26 25  BUN 22* 22*  CREATININE 1.23 1.12  GLUCOSE 109* 73  CALCIUM 9.4 8.8*   Recent Labs    11/05/17 1920 11/06/17 0531  INR 1.78 1.71    Neurovascular intact Sensation intact distally  Moves toes easily Dressing/splint intact  Anticipated LOS equal to or greater than 2 midnights due to - Age 82 and older with one or more of the following:  - Obesity  - Expected need for hospital services (PT, OT, Nursing) required for safe  discharge  - Anticipated need for postoperative skilled nursing care or inpatient rehab  - Active co-morbidities: Diabetes OR   - Unanticipated findings during/Post Surgery: None  - Patient is a high risk of re-admission due to:     Assessment/Plan: Principal Problem:   Tibia/fibula fracture, right, closed, initial encounter Active Problems:   (HFpEF) heart failure with preserved ejection fraction (HCC)   T2DM (type 2 diabetes mellitus) (HCC)   Chronic atrial fibrillation (HCC)   Chronic anticoagulation   CAD,  multiple vessel   PAD (peripheral artery disease) (HCC)  1 Day Post-Op Procedure(s) (LRB): INTRAMEDULLARY (IM) NAIL TIBIAL (Right) Advance diet Up with therapy Discharge home with home health when medically stable and PT goals met Restart Coumadin today    BLAIR ROBERTS 11/07/2017, 10:32 AM  [phone] 

## 2017-11-07 NOTE — Progress Notes (Signed)
   Subjective: [name] was seen laying in his bed this morning. Complains of pain from peripheral neuropathy. Not yet worked with PT  Objective:  Vital signs in last 24 hours: Vitals:   11/06/17 1300 11/06/17 1354 11/06/17 2056 11/07/17 0807  BP: (!) 147/88 121/76 114/61 (!) 143/130  Pulse: 77 66 70 (!) 118  Resp: 12 12    Temp: 97.7 F (36.5 C) 97.9 F (36.6 C) 98.3 F (36.8 C) 97.7 F (36.5 C)  TempSrc:  Oral Oral Oral  SpO2: 95% 100% 100% 96%  Weight:      Height:       Physical Exam  Constitutional: He is oriented to person, place, and time. He appears well-developed and well-nourished. No distress.  Eyes: EOM are normal. Right eye exhibits no discharge. Left eye exhibits no discharge.  Cardiovascular: Normal rate, regular rhythm, normal heart sounds and intact distal pulses.  Pulmonary/Chest: Effort normal and breath sounds normal. No respiratory distress.  Abdominal: Soft. Bowel sounds are normal. He exhibits no distension. There is no tenderness.  Musculoskeletal:  RLE splinted and wrapped  Neurological: He is alert and oriented to person, place, and time.  Skin: Skin is warm and dry.   Assessment/Plan: 84 y.o male with CAD s/p CABG in 2004, HFpEF, CVD (L CEA in 1999 [facility] and right CEA occulusion), PAD, DM, HTN, and Chronic A-Fib who presented to the ED with right leg pain found to have displaced spiral tib-fib fracture s/p intramedullary nailing 6/21.   Right Displaced Spiral Tib-Fib Fracture Intramedullary nailing 6/21 Secondary to mechanical fall without prodrome which would suggest a neurogenic or cardiac etiology. Pain controlled with current regimen. Needs evaluation by PT for disposition planning; pt partial weight bearing and should be able to participate.  > RLE in long-leg splint for stabilization  - Appreciate orthopedics recommendations - Warfarin resumed today - Pain control with tylenol, oxy IR prn - Consider Bisphosphonate at  follow-up  Thrombocytopenia:  Baseline in the 80s-130s per care everywhere. At baseline. - Will continue to monitor  T2DM, with peripheral neuropathy: On glipizide 10 mg QD, Metformin 500 mg QD at home. On Lyrica 150mg  BID for neuropathy. No recorded hypoglycemia overnight. -  SSI-S wc  Chronic Atrial Fibrillation: CHA2DS2-VASc Score of 8. EKG on admission showed rate controlled A fib. Resume Coumadin today.  -  Continue Metoprolol 25 mg BID   HFpEF: Echo (06/2014): EF 55%, LVH with hyperlipomatosis of IAS. No valvular abnormalities noted; R atrial dilation. On metoprolol 25mg  BID, quinapril 5mg  daily and furosemide 40mg  PRN at home.  - Continue Metoprolol 25 mg BID, Lisinopril 5  PAD Carotid Artery Stenosis CAD s/p CABG: No ischemic signs or symptoms.  - Continue Clopidogrel 75 mg QD, Lovastatin 40 mg QD, and  ACE-I mg QD  Diet: CM/HH DVT ppx: sq heparin > warfarin Code: FULL  Dispo: Anticipated discharge in approximately 2-3 day(s).   Jimmy Madewell, DO 11/07/2017, 12:13 PM Pager: [phone]

## 2017-11-07 NOTE — Plan of Care (Signed)

## 2017-11-07 NOTE — Evaluation (Signed)
Physical Therapy Evaluation Patient Details Name: Jimmy MaceKenneth Lawrence MRN: [id number] DOB: May 10, 1933 Today's Date: 11/07/2017   History of Present Illness  Pt is an 84 y/o male with PMH of  CAD s/p CABG in 2004, HFpEF, CVD [facility] and [facility]), PAD, DM, HTN, and Chronic A-Fib who presented to the ED with right leg pain found to have displaced spiral tib-fib fracture s/p intramedullary nailing 6/21.    Clinical Impression  Pt presented supine in bed with HOB elevated, awake and willing to participate in therapy session. Prior to admission, pt reported that he ambulated with use of RW intermittently and was independent with ADLs. Pt lives with his wife in a single level house with three or four steps to enter. Pt currently requires mod A x2 for bed mobility and mod A x2 for safety with transfers with use of RW. Pt would greatly benefit from ST rehab at a SNF prior to returning home with family; however, pt and pt's family are under the impression that they will be d/c'ing home tomorrow. PT will continue to follow pt acutely and progress mobility as tolerated.  Pt would continue to benefit from skilled physical therapy services at this time while admitted and after d/c to address the below listed limitations in order to improve overall safety and independence with functional mobility.     Follow Up Recommendations SNF    Equipment Recommendations  3in1 (PT);Wheelchair (measurements PT);Wheelchair cushion (measurements PT);Other (comment)(w/c with elevating leg rests)    Recommendations for Other Services       Precautions / Restrictions Precautions Precautions: Fall Restrictions Weight Bearing Restrictions: Yes RLE Weight Bearing: Partial weight bearing RLE Partial Weight Bearing Percentage or Pounds: 25%      Mobility  Bed Mobility Overal bed mobility: Needs Assistance Bed Mobility: Supine to Sit     Supine to sit: Mod assist;+2 for physical  assistance     General bed mobility comments: assist for movement of R LE off of bed and for trunk elevation, increased time and effort  Transfers Overall transfer level: Needs assistance Equipment used: Rolling walker (2 wheeled) Transfers: Sit to/from UGI CorporationStand;Stand Pivot Transfers Sit to Stand: Mod assist;+2 safety/equipment Stand pivot transfers: Mod assist;+2 safety/equipment       General transfer comment: increased time and effort, cueing for safe hand placement and technique, min A to maintain WB'ing precautions on R LE and mod A to power into standing as well as with pivotal movements to chair  Ambulation/Gait                Stairs            Wheelchair Mobility    Modified Rankin (Stroke Patients Only)       Balance Overall balance assessment: Needs assistance   Sitting balance-Leahy Scale: Fair     Standing balance support: Bilateral upper extremity supported Standing balance-Leahy Scale: Poor                               Pertinent Vitals/Pain Pain Assessment: Faces Faces Pain Scale: Hurts little more Pain Location: R LE Pain Descriptors / Indicators: Guarding Pain Intervention(s): Monitored during session;Repositioned    Home Living Family/patient expects to be discharged to:: Private residence Living Arrangements: Spouse/significant other Available Help at Discharge: Family;Available 24 hours/day Type of Home: House Home Access: Stairs to enter Entrance Stairs-Rails: Right;Left;Can reach both Entrance Stairs-Number of Steps: 3-4  Home Layout: One level Home Equipment: Cane - single point;Walker - 4 wheels;Walker - 2 wheels      Prior Function Level of Independence: Independent with assistive device(s)         Comments: pt reported that he ambulated with RW and was independent with ADLs     Hand Dominance        Extremity/Trunk Assessment   Upper Extremity Assessment Upper Extremity Assessment: Overall WFL for  tasks assessed    Lower Extremity Assessment Lower Extremity Assessment: Generalized weakness;RLE deficits/detail RLE Deficits / Details: pt with decreased strength and ROM limitations secondary to post-op pain and weakness       Communication   Communication: HOH  Cognition Arousal/Alertness: Awake/alert Behavior During Therapy: WFL for tasks assessed/performed Overall Cognitive Status: Impaired/Different from baseline Area of Impairment: Safety/judgement;Problem solving                         Safety/Judgement: Decreased awareness of deficits;Decreased awareness of safety   Problem Solving: Difficulty sequencing;Requires verbal cues        General Comments      Exercises     Assessment/Plan    PT Assessment Patient needs continued PT services  PT Problem List Decreased strength;Decreased range of motion;Decreased activity tolerance;Decreased balance;Decreased coordination;Decreased mobility;Decreased knowledge of use of DME;Decreased safety awareness;Decreased knowledge of precautions;Pain       PT Treatment Interventions DME instruction;Gait training;Stair training;Therapeutic activities;Functional mobility training;Therapeutic exercise;Balance training;Neuromuscular re-education;Patient/family education    PT Goals (Current goals can be found in the Care Plan section)  Acute Rehab PT Goals Patient Stated Goal: return home tomorrow PT Goal Formulation: With patient/family Time For Goal Achievement: 11/21/17 Potential to Achieve Goals: Good    Frequency Min 3X/week   Barriers to discharge        Co-evaluation               AM-PAC PT "6 Clicks" Daily Activity  Outcome Measure Difficulty turning over in bed (including adjusting bedclothes, sheets and blankets)?: Unable Difficulty moving from lying on back to sitting on the side of the bed? : Unable Difficulty sitting down on and standing up from a chair with arms (e.g., wheelchair, bedside  commode, etc,.)?: Unable Help needed moving to and from a bed to chair (including a wheelchair)?: A Lot Help needed walking in hospital room?: A Lot Help needed climbing 3-5 steps with a railing? : Total 6 Click Score: 8    End of Session Equipment Utilized During Treatment: Gait belt Activity Tolerance: Patient limited by pain;Patient limited by fatigue Patient left: in chair;with call bell/phone within reach;with chair alarm set;with family/visitor present Nurse Communication: Mobility status PT Visit Diagnosis: Other abnormalities of gait and mobility (R26.89)    Time: 9147-8295 PT Time Calculation (min) (ACUTE ONLY): 23 min   Charges:   PT Evaluation $PT Eval Moderate Complexity: 1 Mod PT Treatments $Therapeutic Activity: 8-22 mins   PT G Codes:        [place], PT, DPT [phone]   Alessandra Bevels Malory Spurr 11/07/2017, 1:55 PM

## 2017-11-07 NOTE — NC FL2 (Signed)
[place] MEDICAID FL2 LEVEL OF CARE SCREENING TOOL     IDENTIFICATION  Patient Name: Jimmy Lawrence Birthdate: 1932-12-04 Sex: male Admission Date (Current Location): 11/05/2017  [facility] and [place] Number:  Chiropodist and Address:  [facility], [address] [place], [place] [address]      Provider Number: [id number]  Attending Physician Name and Address:  Earl Lagos, MD  Relative Name and Phone Number:  Jimmy Lawrence, wife, [phone]    Current Level of Care: Hospital Recommended Level of Care: Skilled Nursing Facility Prior Approval Number:    Date Approved/Denied:   PASRR Number:   [id number]  Discharge Plan: SNF    Current Diagnoses: Patient Active Problem List   Diagnosis Date Noted  . Tibia/fibula fracture, right, closed, initial encounter 11/05/2017  . (HFpEF) heart failure with preserved ejection fraction (HCC) 11/05/2017  . T2DM (type 2 diabetes mellitus) (HCC) 11/05/2017  . Chronic atrial fibrillation (HCC) 11/05/2017  . Chronic anticoagulation 11/05/2017  . CAD, multiple vessel 11/05/2017  . PAD (peripheral artery disease) (HCC) 11/05/2017    Orientation RESPIRATION BLADDER Height & Weight     Self, Time, Situation, Place  Normal Continent Weight: 185 lb (83.9 kg) Height:  5\' 7"  (170.2 cm)  BEHAVIORAL SYMPTOMS/MOOD NEUROLOGICAL BOWEL NUTRITION STATUS      Continent Diet(see discharge summary)  AMBULATORY STATUS COMMUNICATION OF NEEDS Skin   Extensive Assist Verbally Surgical wounds, Skin abrasions(surgical incision on rught leg with compression wrap and ortho boot; abrasions on right and left hand and arm)                       Personal Care Assistance Level of Assistance  Bathing, Feeding, Dressing Bathing Assistance: Maximum assistance Feeding assistance: Independent Dressing Assistance: Maximum assistance     Functional Limitations Info  Sight, Hearing, Speech Sight Info:  Adequate Hearing Info: Impaired Speech Info: Adequate    SPECIAL CARE FACTORS FREQUENCY  PT (By licensed PT), OT (By licensed OT)     PT Frequency: 5x week OT Frequency: 5x week            Contractures Contractures Info: Not present    Additional Factors Info  Code Status, Allergies, Insulin Sliding Scale Code Status Info: Full Code Allergies Info: Penicillins   Insulin Sliding Scale Info: insulin aspart (novoLOG) injection 0-15 Units 3x day with meals; insulin aspart (novoLOG) injection 0-5 Units daily at bedtime       Current Medications (11/07/2017):  This is the current hospital active medication list Current Facility-Administered Medications  Medication Dose Route Frequency Provider Last Rate Last Dose  . acetaminophen (TYLENOL) tablet 650 mg  650 mg Oral Q6H PRN Haddix, Gillie Manners, MD   650 mg at 11/06/17 0516   Or  . acetaminophen (TYLENOL) suppository 650 mg  650 mg Rectal Q6H PRN Haddix, Gillie Manners, MD      . clopidogrel (PLAVIX) tablet 75 mg  75 mg Oral Q breakfast Haddix, Gillie Manners, MD   75 mg at 11/07/17 0814  . fluticasone (FLONASE) 50 MCG/ACT nasal spray 2 spray  2 spray Each Nare BID PRN Haddix, Gillie Manners, MD      . heparin injection 5,000 Units  5,000 Units Subcutaneous Q8H Haddix, Gillie Manners, MD   5,000 Units at 11/07/17 1316  . HYDROmorphone (DILAUDID) injection 0.5 mg  0.5 mg Intravenous Q4H PRN Haddix, Gillie Manners, MD   0.5 mg at 11/06/17 2011  . insulin aspart (novoLOG)  injection 0-15 Units  0-15 Units Subcutaneous TID WC Haddix, Gillie MannersKevin P, MD   3 Units at 11/07/17 0710  . insulin aspart (novoLOG) injection 0-5 Units  0-5 Units Subcutaneous QHS Haddix, Gillie MannersKevin P, MD      . lactated ringers infusion   Intravenous Continuous Haddix, Gillie MannersKevin P, MD 10 mL/hr at 11/07/17 1531    . lisinopril (PRINIVIL,ZESTRIL) tablet 5 mg  5 mg Oral Daily Haddix, Gillie MannersKevin P, MD   5 mg at 11/07/17 0814  . metoprolol tartrate (LOPRESSOR) tablet 25 mg  25 mg Oral BID WC Haddix, Gillie MannersKevin P, MD   25 mg at  11/07/17 1620  . ondansetron (ZOFRAN) tablet 4 mg  4 mg Oral Q6H PRN Haddix, Gillie MannersKevin P, MD   4 mg at 11/07/17 0459   Or  . ondansetron (ZOFRAN) injection 4 mg  4 mg Intravenous Q6H PRN Haddix, Gillie MannersKevin P, MD   4 mg at 11/06/17 2100  . oxyCODONE (Oxy IR/ROXICODONE) immediate release tablet 5 mg  5 mg Oral Q4H PRN Haddix, Gillie MannersKevin P, MD   5 mg at 11/07/17 1620  . pravastatin (PRAVACHOL) tablet 40 mg  40 mg Oral q1800 Haddix, Gillie MannersKevin P, MD   40 mg at 11/06/17 1858  . pregabalin (LYRICA) capsule 150 mg  150 mg Oral BID WC Haddix, Gillie MannersKevin P, MD   150 mg at 11/07/17 1620  . warfarin (COUMADIN) tablet 5 mg  5 mg Oral ONCE-1800 Armandina StammerBatchelder, Nathan J, RPH      . Warfarin - Pharmacist Dosing Inpatient   Does not apply q1800 Armandina StammerBatchelder, Nathan J, [facility]         Discharge Medications: Please see discharge summary for a list of discharge medications.  Relevant Imaging Results:  Relevant Lab Results:   Additional Information SS#[id number]  Jimmy Lawrence, [place]

## 2017-11-07 NOTE — Social Work (Signed)
CSW acknowledging consult for SNF, will initiate SNF process. Pt has [employer] Medicare (closed on weekend) which may take 48-72 hours for approval once open for SNF.   Will follow up with pt regarding SNF and choices.   Jimmy HutchingIsabel H Metro Lawrence, LCSWA [facility] Clinical Social Work [phone]

## 2017-11-07 NOTE — Evaluation (Signed)
Occupational Therapy Evaluation Patient Details Name: Jimmy Lawrence MRN: [id number] DOB: Jun 10, 1932 Today's Date: 11/07/2017    History of Present Illness Pt is an 84 y/o male with PMH of  CAD s/p CABG in 2004, HFpEF, CVD (L CEA in 1999 complicated by [facility] and right CEA occulusion), PAD, DM, HTN, and Chronic A-Fib who presented to the ED with right leg pain found to have displaced spiral tib-fib fracture s/p intramedullary nailing 6/21.   Clinical Impression   PTA, pt was independent with RW for ADL and functional mobility. He reports enjoying working in the yard. He is highly motivated to return home and states he wants to leave tomorrow morning. However, at this time, pt requiring max assist for LB ADL, mod assist for very short distance ambulating toilet transfer, and min assist for all bed mobility. He would benefit from continued OT services while admitted to maximize independence and safety with ADL and functional mobility prior to returning home. At current functional level, feel that pt would best benefit from short-term SNF placement; however, he is adamant concerning his desire to return home. If he is to return home, he will need maximum home health services including OT. Will continue to follow while admitted.     Follow Up Recommendations  Supervision/Assistance - 24 hour;SNF    Equipment Recommendations  3 in 1 bedside commode    Recommendations for Other Services       Precautions / Restrictions Precautions Precautions: Fall Restrictions Weight Bearing Restrictions: Yes RLE Weight Bearing: Partial weight bearing RLE Partial Weight Bearing Percentage or Pounds: 25%      Mobility Bed Mobility Overal bed mobility: Needs Assistance Bed Mobility: Supine to Sit     Supine to sit: Min assist     General bed mobility comments: Min assist to progress toward EOB.   Transfers Overall transfer level: Needs assistance Equipment used: Rolling walker (2  wheeled) Transfers: Sit to/from Stand Sit to Stand: Mod assist         General transfer comment: Mod assist to power up to standing. Cues for safe use of RW.     Balance Overall balance assessment: Needs assistance Sitting-balance support: No upper extremity supported;Feet supported Sitting balance-Leahy Scale: Fair     Standing balance support: Bilateral upper extremity supported Standing balance-Leahy Scale: Poor Standing balance comment: BUE support throughout all standing tasks.                            ADL either performed or assessed with clinical judgement   ADL Overall ADL's : Needs assistance/impaired Eating/Feeding: Set up;Sitting   Grooming: Set up;Sitting   Upper Body Bathing: Supervision/ safety;Sitting   Lower Body Bathing: Maximal assistance;Sit to/from stand   Upper Body Dressing : Supervision/safety;Sitting   Lower Body Dressing: Maximal assistance;Sit to/from stand   Toilet Transfer: Moderate assistance;Ambulation;RW Toilet Transfer Details (indicate cue type and reason): Taking a few steps (approximately 5 feet distance) maintaining 25% weight bearing.  Toileting- Clothing Manipulation and Hygiene: Maximal assistance;Sit to/from stand       Functional mobility during ADLs: Moderate assistance;Rolling walker(minimal distance) General ADL Comments: Pt and wife concerning donning/doffing Cam walker on LLE. Pt requiring cues for safety and maintenance of 25% weight bearing.      Vision Patient Visual Report: No change from baseline Vision Assessment?: No apparent visual deficits     Perception     Praxis      Pertinent Vitals/Pain Pain Assessment: Faces  Faces Pain Scale: Hurts even more Pain Location: R LE Pain Descriptors / Indicators: Guarding Pain Intervention(s): Monitored during session;Repositioned     Hand Dominance     Extremity/Trunk Assessment Upper Extremity Assessment Upper Extremity Assessment: Overall WFL for  tasks assessed   Lower Extremity Assessment Lower Extremity Assessment: Defer to PT evaluation       Communication Communication Communication: HOH   Cognition Arousal/Alertness: Awake/alert Behavior During Therapy: Impulsive Overall Cognitive Status: Impaired/Different from baseline Area of Impairment: Safety/judgement;Problem solving;Awareness                         Safety/Judgement: Decreased awareness of deficits;Decreased awareness of safety Awareness: Intellectual Problem Solving: Difficulty sequencing;Requires verbal cues;Slow processing General Comments: Pt with poor awareness of safety throughout session. He continues to report that he is going to drive his tractor even with injury. Educated pt and family is in agreement. Unsure if pt close to baseline cognitively.    General Comments  Pt's wife present at beginning of session. Daughter entered at end of session.     Exercises     Shoulder Instructions      Home Living Family/patient expects to be discharged to:: Private residence Living Arrangements: Spouse/significant other Available Help at Discharge: Family;Available 24 hours/day Type of Home: House Home Access: Stairs to enter Enter[employer] CorporationEntrance Stairs-Number of Steps: 3-4 Entrance Stairs-Rails: Right;Left;Can reach both Home Layout: One level     Bathroom Shower/Tub: Walk-in shower         Home Equipment: Gilmer MorCane - single point;Walker - 4 wheels;Walker - 2 wheels          Prior Functioning/Environment Level of Independence: Independent with assistive device(s)        Comments: Pt reports use of RW for mobility and independence with ADL. He reports using his tractor and was independent with ADL.         OT Problem List: Decreased strength;Decreased range of motion;Decreased activity tolerance;Impaired balance (sitting and/or standing);Decreased safety awareness;Decreased knowledge of use of DME or AE;Decreased knowledge of precautions;Decreased  cognition;Pain      OT Treatment/Interventions: Self-care/ADL training;Therapeutic exercise;Energy conservation;DME and/or AE instruction;Therapeutic activities;Patient/family education;Balance training    OT Goals(Current goals can be found in the care plan section) Acute Rehab OT Goals Patient Stated Goal: return home tomorrow OT Goal Formulation: With patient/family Time For Goal Achievement: 11/21/17 Potential to Achieve Goals: Good ADL Goals Pt Will Perform Grooming: with min guard assist;standing Pt Will Perform Lower Body Bathing: with min assist;sit to/from stand Pt Will Perform Lower Body Dressing: with min assist;sit to/from stand Pt Will Transfer to Toilet: with min guard assist;ambulating;bedside commode(BSC over toilet) Pt Will Perform Toileting - Clothing Manipulation and hygiene: with min guard assist;sit to/from stand  OT Frequency: Min 2X/week   Barriers to D/C:            Co-evaluation              AM-PAC PT "6 Clicks" Daily Activity     Outcome Measure Help from another person eating meals?: None Help from another person taking care of personal grooming?: None(in sitting) Help from another person toileting, which includes using toliet, bedpan, or urinal?: A Lot Help from another person bathing (including washing, rinsing, drying)?: A Lot Help from another person to put on and taking off regular upper body clothing?: None Help from another person to put on and taking off regular lower body clothing?: A Lot 6 Click Score: 18   End of  Session Equipment Utilized During Treatment: Gait belt;Rolling walker  Activity Tolerance: Patient tolerated treatment well Patient left: in chair;with call bell/phone within reach;with family/visitor present  OT Visit Diagnosis: Other abnormalities of gait and mobility (R26.89);Pain Pain - Right/Left: Right Pain - part of body: Leg                Time: 1335-1406 OT Time Calculation (min): 31 min Charges:  OT General  Charges $OT Visit: 1 Visit OT Evaluation $OT Eval Moderate Complexity: 1 Mod OT Treatments $Self Care/Home Management : 8-22 mins G-Codes:     Doristine Section, MS OTR/L  Pager: [phone]   Vivianne Carles A Beula Joyner 11/07/2017, 5:06 PM

## 2017-11-08 LAB — CBC
HEMATOCRIT: 29.8 % — AB (ref 39.0–52.0)
Hemoglobin: 10 g/dL — ABNORMAL LOW (ref 13.0–17.0)
MCH: 33 pg (ref 26.0–34.0)
MCHC: 33.6 g/dL (ref 30.0–36.0)
MCV: 98.3 fL (ref 78.0–100.0)
Platelets: 78 10*3/uL — ABNORMAL LOW (ref 150–400)
RBC: 3.03 MIL/uL — ABNORMAL LOW (ref 4.22–5.81)
RDW: 13.4 % (ref 11.5–15.5)
WBC: 7.1 10*3/uL (ref 4.0–10.5)

## 2017-11-08 LAB — GLUCOSE, CAPILLARY
GLUCOSE-CAPILLARY: 100 mg/dL — AB (ref 65–99)
GLUCOSE-CAPILLARY: 153 mg/dL — AB (ref 65–99)
GLUCOSE-CAPILLARY: 136 mg/dL — AB (ref 65–99)
Glucose-Capillary: 103 mg/dL — ABNORMAL HIGH (ref 65–99)

## 2017-11-08 LAB — PROTIME-INR
INR: 1.54
Prothrombin Time: 18.4 seconds — ABNORMAL HIGH (ref 11.4–15.2)

## 2017-11-08 MED ORDER — WARFARIN SODIUM 5 MG PO TABS
5.0000 mg | ORAL_TABLET | Freq: Once | ORAL | Status: AC
  Administered 2017-11-08: 5 mg via ORAL
  Filled 2017-11-08: qty 1

## 2017-11-08 NOTE — Progress Notes (Signed)
ANTICOAGULATION CONSULT NOTE - Initial Consult  Pharmacy Consult for Coumadin Indication: atrial fibrillation  Allergies  Allergen Reactions  . Penicillins Hives    Tolerated cefazolin in June 2019.  Has patient had a PCN reaction causing immediate rash, facial/tongue/throat swelling, SOB or lightheadedness with hypotension: No Has patient had a PCN reaction causing severe rash involving mucus membranes or skin necrosis: No Has patient had a PCN reaction that required hospitalization: No Has patient had a PCN reaction occurring within the last 10 years: No If all of the above answers are "NO", then may proceed with Cephalosporin use.    Patient Measurements: Height: 5\' 7"  (170.2 cm) Weight: 185 lb (83.9 kg) IBW/kg (Calculated) : 66.1  Vital Signs: Temp: 97.7 F (36.5 C) (06/23 0347) Temp Source: Oral (06/23 0347) BP: 132/79 (06/23 0347) Pulse Rate: 68 (06/23 0347)  Labs: Recent Labs    11/05/17 1715 11/05/17 1920 11/06/17 0531 11/08/17 0410  HGB 13.4  --  12.2* 10.0*  HCT 40.2  --  35.9* 29.8*  PLT 110*  --  82* 78*  LABPROT  --  20.6* 19.9* 18.4*  INR  --  1.78 1.71 1.54  CREATININE 1.23  --  1.12  --     Estimated Creatinine Clearance: 51.7 mL/min (by C-G formula based on SCr of 1.12 mg/dL).   Medical History: Past Medical History:  Diagnosis Date  . CAD (coronary artery disease)   . Carotid artery stenosis, asymptomatic, bilateral    s/p CEA 1999  . PAD (peripheral artery disease) (HCC)   . Peripheral neuropathy   . SAH (subarachnoid hemorrhage) (HCC)    post CEA in 1999  . T2DM (type 2 diabetes mellitus) (HCC)    Assessment: 84 yo on Coumadin 5mg  daily exc for 2.5mg  on MWF PTA for Afib. Held for ortho surgery. INR was 1.71 on 6/21. Now to restart on 6/22. INR now 1.12. Hgb 10, plts low at 78.    Goal of Therapy:  INR 2-3 Monitor platelets by anticoagulation protocol: Yes   Plan:  Give Coumadin tonight at 5mg  PO x 1 Monitor daily INR, CBC, s/s of  bleed  Will d/c heparin SQ once INR > 2 or if plts fall below 50  Jimmy Lawrence J 11/08/2017,10:50 AM

## 2017-11-08 NOTE — Progress Notes (Signed)
Occupational Therapy Treatment Patient Details Name: Trevor MaceKenneth Leggio MRN: [id number] DOB: 09/23/1932 Today's Date: 11/08/2017    History of present illness Pt is an 84 y/o male with PMH of  CAD s/p CABG in 2004, HFpEF, CVD ([facility] and [facility]), PAD, DM, HTN, and Chronic A-Fib who presented to the ED with right leg pain found to have displaced spiral tib-fib fracture s/p intramedullary nailing 6/21.   OT comments  Pt progressing towards established OT goals. Providing education on compensatory techniques for LB dressing. Pt donning underwear with Mod A requiring assistance to don pants over RLE/boot and then maintain balance while pulling pants over hips. Requiring Mod A to power up into standing and VCs for safety during functional transfers. Pt continues to demonstrate adherence to [facility] status, but fatigues quickly with activity. Reviewed recommendation for post-acute rehab with pt and family. Wife and daughter verbalizing agreement and understanding of need for rehab. Family encouraging pt about benefits of rehab. Will continue to follow acutely as admitted and continues to recommend dc to SNF for further OT.    Follow Up Recommendations  Supervision/Assistance - 24 hour;SNF    Equipment Recommendations  3 in 1 bedside commode    Recommendations for Other Services      Precautions / Restrictions Precautions Precautions: Fall Restrictions Weight Bearing Restrictions: Yes RLE Weight Bearing: Partial weight bearing RLE Partial Weight Bearing Percentage or Pounds: 25%       Mobility Bed Mobility               General bed mobility comments: In recliner upon arrival  Transfers Overall transfer level: Needs assistance Equipment used: Rolling walker (2 wheeled) Transfers: Sit to/from Stand Sit to Stand: Mod assist Stand pivot transfers: Mod assist;+2 safety/equipment       General transfer comment: Mod assist to power up to standing.  Cues for safe use of RW.     Balance Overall balance assessment: Needs assistance Sitting-balance support: No upper extremity supported;Feet supported Sitting balance-Leahy Scale: Fair     Standing balance support: Bilateral upper extremity supported Standing balance-Leahy Scale: Poor Standing balance comment: BUE support throughout all standing tasks.                            ADL either performed or assessed with clinical judgement   ADL Overall ADL's : Needs assistance/impaired                     Lower Body Dressing: Sit to/from stand;Moderate assistance;+2 for safety/equipment Lower Body Dressing Details (indicate cue type and reason): Pt donning underwear with assistance to don over boot on LLE. Pt requiring Mod A for standing balance while pulling pants over hips. Daughter present for +2 safety amnd encouraging pt to participate and maintain safety awareness Toilet Transfer: Moderate assistance;Ambulation;RW(Simulated to toilet) Toilet Transfer Details (indicate cue type and reason): Pt performing room level functional mobility and simulated toilet transfer. Pt maintaining WBing status with Min VCs as pt fatigued.          Functional mobility during ADLs: Moderate assistance;Rolling walker(minimal distance) General ADL Comments: Pt continues to present with decreased balance, poor safety awarness, and limitations due to pain. Pt able to verbalize that he is only allowed to put "25 pounds" through LLE; daughter correcting him to 25%     Vision   Vision Assessment?: No apparent visual deficits   Perception  Praxis      Cognition Arousal/Alertness: Awake/alert Behavior During Therapy: Impulsive Overall Cognitive Status: Impaired/Different from baseline Area of Impairment: Safety/judgement;Problem solving;Awareness                         Safety/Judgement: Decreased awareness of deficits;Decreased awareness of safety Awareness:  Intellectual Problem Solving: Difficulty sequencing;Requires verbal cues;Slow processing General Comments: Pt with poor awareness of safety throughout session. Pt agreeable to therapy but poor awareness for needed physical A. Educating pt and family on need for rehab and daughter and wife verbalizing understanding. Encouraging pt that SNF rehab is a good idea.        Exercises     Shoulder Instructions       General Comments Wife and daughter present throughout session    Pertinent Vitals/ Pain       Pain Assessment: Faces Faces Pain Scale: Hurts even more Pain Location: R LE Pain Descriptors / Indicators: Guarding;Discomfort;Grimacing Pain Intervention(s): Monitored during session;Limited activity within patient's tolerance;Patient requesting pain meds-RN notified;Repositioned  Home Living                                          Prior Functioning/Environment              Frequency  Min 2X/week        Progress Toward Goals  OT Goals(current goals can now be found in the care plan section)  Progress towards OT goals: Progressing toward goals  Acute Rehab OT Goals Patient Stated Goal: return home tomorrow OT Goal Formulation: With patient/family Time For Goal Achievement: 11/21/17 Potential to Achieve Goals: Good ADL Goals Pt Will Perform Grooming: with min guard assist;standing Pt Will Perform Lower Body Bathing: with min assist;sit to/from stand Pt Will Perform Lower Body Dressing: with min assist;sit to/from stand Pt Will Transfer to Toilet: with min guard assist;ambulating;bedside commode(BSC over toilet) Pt Will Perform Toileting - Clothing Manipulation and hygiene: with min guard assist;sit to/from stand  Plan Discharge plan remains appropriate    Co-evaluation                 AM-PAC PT "6 Clicks" Daily Activity     Outcome Measure   Help from another person eating meals?: None Help from another person taking care of personal  grooming?: None(in sitting) Help from another person toileting, which includes using toliet, bedpan, or urinal?: A Lot Help from another person bathing (including washing, rinsing, drying)?: A Lot Help from another person to put on and taking off regular upper body clothing?: None Help from another person to put on and taking off regular lower body clothing?: A Lot 6 Click Score: 18    End of Session Equipment Utilized During Treatment: Gait belt;Rolling walker  OT Visit Diagnosis: Other abnormalities of gait and mobility (R26.89);Pain Pain - Right/Left: Right Pain - part of body: Leg   Activity Tolerance Patient tolerated treatment well   Patient Left in chair;with call bell/phone within reach;with family/visitor present   Nurse Communication Mobility status;Weight bearing status        Time: 1914-7829 OT Time Calculation (min): 21 min  Charges: OT General Charges $OT Visit: 1 Visit OT Treatments $Self Care/Home Management : 8-22 mins  Nekeshia Lenhardt MSOT, OTR/L Acute Rehab Pager: [phone] Office: [phone]   Theodoro Grist Kitt Ledet 11/08/2017, 10:21 AM

## 2017-11-08 NOTE — Progress Notes (Signed)
Subjective: 2 Days Post-Op Procedure(s) (LRB): INTRAMEDULLARY (IM) NAIL TIBIAL (Right) Patient reports pain as moderate.   No difficulty voiding No BM+flatus Objective: Vital signs in last 24 hours: Temp:  [97.7 F (36.5 C)-98.5 F (36.9 C)] 97.7 F (36.5 C) (06/23 0347) Pulse Rate:  [68-78] 68 (06/23 0347) Resp:  [17] 17 (06/23 0347) BP: (114-132)/(63-79) 132/79 (06/23 0347) SpO2:  [94 %-97 %] 97 % (06/23 0347)  Intake/Output from previous day: 06/22 0701 - 06/23 0700 In: 603 [P.O.:360; I.V.:243] Out: 910 [Urine:910] Intake/Output this shift: No intake/output data recorded.  Recent Labs    11/05/17 1715 11/06/17 0531 11/08/17 0410  HGB 13.4 12.2* 10.0*   Recent Labs    11/06/17 0531 11/08/17 0410  WBC 6.0 7.1  RBC 3.70* 3.03*  HCT 35.9* 29.8*  PLT 82* 78*   Recent Labs    11/05/17 1715 11/06/17 0531  NA 145 142  K 4.4 4.2  CL 111 112*  CO2 26 25  BUN 22* 22*  CREATININE 1.23 1.12  GLUCOSE 109* 73  CALCIUM 9.4 8.8*   Recent Labs    11/06/17 0531 11/08/17 0410  INR 1.71 1.54    Neurovascular intact Sensation intact distally Intact pulses distally dressing, cam boot in good repair/position  Principal Problem:   Tibia/fibula fracture, right, closed, initial encounter Active Problems:   (HFpEF) heart failure with preserved ejection fraction (HCC)   T2DM (type 2 diabetes mellitus) (HCC)   Chronic atrial fibrillation (HCC)   Chronic anticoagulation   CAD, multiple vessel   PAD (peripheral artery disease) (HCC)   Assessment/Plan: 2 Days Post-Op Procedure(s) (LRB): INTRAMEDULLARY (IM) NAIL TIBIAL (Right) Up with therapy 25%weight bearing Awaiting PT reevaluation regarding home vs SNF-patient and family somewhat indecisive on best course as they wiah to return home but want to maximize outcome; encouraged patient to make maximal efforts in PT/OT to improve chances for home at discharge, but to continue rehab search to have option in place as  well.    BLAIR ROBERTS 11/08/2017, 12:58 PM

## 2017-11-08 NOTE — Clinical Social Work Note (Signed)
At this time pt only has one bed offer at [facility]--facility requested by family IF pt can not go home with HH (families preference)--pending PT eval today. However, Drucie Opitz[employer]na auth would need to be obtained prior to d/c--[employer] does not authorize on the weekend and takes a few days to receive after being initiated. CSW continuing to follow for disposition.  Beulah BeachBridget Sharesa Kemp, ConnecticutLCSWA [phone]

## 2017-11-08 NOTE — Progress Notes (Signed)
   Subjective: Jimmy Lawrence was seen laying in his bed this morning, son-in-law at bedside. No complaints and reports pain is controlled with PO regimen. Eating well but no BM yet. Expressed strong desire for DC home and son-in-law notes they are going on vacation next Saturday and patient *will* be joining.   Reviewed CSW note from today; family asked PT for re-eval in hopes [facility] (apparently pts insurance will likely take several days for authorization).   Objective:  Vital signs in last 24 hours: Vitals:   11/06/17 2056 11/07/17 0807 11/07/17 2100 11/08/17 0347  BP: 114/61 (!) 143/130 114/63 132/79  Pulse: 70 (!) 118 78 68  Resp:   17 17  Temp: 98.3 F (36.8 C) 97.7 F (36.5 C) 98.5 F (36.9 C) 97.7 F (36.5 C)  TempSrc: Oral Oral Oral Oral  SpO2: 100% 96% 94% 97%  Weight:      Height:       Physical Exam  Constitutional: He is oriented to person, place, and time. He appears well-developed and well-nourished. No distress.  Eyes: EOM are normal. Right eye exhibits no discharge. Left eye exhibits no discharge.  Cardiovascular: Normal rate, regular rhythm, normal heart sounds and intact distal pulses.  Pulmonary/Chest: Effort normal and breath sounds normal. No respiratory distress.  Abdominal: Soft. Bowel sounds are normal. He exhibits no distension. There is no tenderness.  Musculoskeletal:  RLE splinted and wrapped  Neurological: He is alert and oriented to person, place, and time.  Skin: Skin is warm and dry.   Assessment/Plan: 84 y.o male with CAD s/p CABG in 2004, HFpEF, CVD (L CEA in 1999 [facility] and right CEA occulusion), PAD, DM, HTN, and Chronic A-Fib who presented to the ED with right leg pain found to have displaced spiral tib-fib fracture s/p intramedullary nailing 6/21.   Right Displaced Spiral Tib-Fib Fracture Intramedullary nailing 6/21 Secondary to mechanical fall without prodrome which would suggest a  neurogenic or cardiac etiology. Pain controlled with current regimen. No BM yet so will add a bowel regimen to avoid symptomatic constipation. Family has asked for PT to re-evaluate in hopes [facility]. Pts insurance will likely take several days to authorize SNF and apparently patient will be going on vacation in 1 week. Suspect patient will still benefit from ST SNF placement.  -Pain control with tylenol, oxy IR prn -Consider Bisphosphonate at follow-up -Follow-up PT recs  Chronic Atrial Fibrillation: CHA2DS2-VASc Score of 8. Warfarin resumed yesterday, INR 1.5. Warfarin per pharmacy. Continue Metoprolol 25 mg BID for rate control.   Thrombocytopenia:  Baseline in the 80s-130s per care everywhere. At baseline.  T2DM, with peripheral neuropathy: On glipizide 10 mg QD, Metformin 500 mg QD at home. On Lyrica 150mg  BID for neuropathy. No recorded hypoglycemia overnight. -  SSI-S wc  HFpEF: Echo (06/2014): EF 55%, LVH with hyperlipomatosis of IAS. No valvular abnormalities noted; R atrial dilation. On metoprolol 25mg  BID, quinapril 5mg  daily and furosemide 40mg  PRN at home.  - Continue Metoprolol 25 mg BID, Lisinopril 5 -Holding Lasix but resume should patient show sign of fluid retention  Diet: CM/HH DVT ppx: sq heparin > warfarin Code: FULL  Dispo: Anticipated discharge pending PT re-evaluation. Family strongly desires.   Albin Duckett, DO 11/08/2017, 11:51 AM Pager: [phone]

## 2017-11-08 NOTE — Progress Notes (Signed)
Physical Therapy Treatment Patient Details Name: Osiris Odriscoll MRN: [id number] DOB: 06/19/1932 Today's Date: 11/08/2017    History of Present Illness Pt is an 84 y/o male with PMH of  CAD s/p CABG in 2004, HFpEF, CVD (L CEA in 1999 complicated by [facility] and right CEA occulusion), PAD, DM, HTN, and Chronic A-Fib who presented to the ED with right leg pain found to have displaced spiral tib-fib fracture s/p intramedullary nailing 6/21.    PT Comments    Patient progressing towards his goals. Displays good pain control. Able to ambulate 20 feet x 2 today with walker and min assist (chair follow utilized). Still requiring up to moderate assistance for transfers due to posterior lean. Continues to present as a high fall risk secondary to decreased safety awareness, balance deficits, and difficulty walking. Continue to highly recommend SNF. Family verbalized understanding and agreement at current recommendation.   Follow Up Recommendations  SNF     Equipment Recommendations  3in1 (PT);Wheelchair (measurements PT);Wheelchair cushion (measurements PT);Other (comment)(w/c with elevating leg rests)    Recommendations for Other Services       Precautions / Restrictions Precautions Precautions: Fall Restrictions Weight Bearing Restrictions: Yes RLE Weight Bearing: Partial weight bearing RLE Partial Weight Bearing Percentage or Pounds: 25%    Mobility  Bed Mobility               General bed mobility comments: OOB in recliner  Transfers Overall transfer level: Needs assistance Equipment used: Rolling walker (2 wheeled) Transfers: Sit to/from [employer] Sit to Stand: Mod assist;+2 safety/equipment         General transfer comment: increased time and effort. max cueing to scoot forward to edge of recliner and for safe hand placement. mod assist to boost up to stand due to initial posterior lean  Ambulation/Gait Ambulation/Gait assistance: Min assist Gait  Distance (Feet): 20 Feet, 20 feet Assistive device: Rolling walker (2 wheeled) Gait Pattern/deviations: Step-to pattern;Decreased step length - right;Decreased step length - left Gait velocity: decreased Gait velocity interpretation: <1.31 ft/sec, indicative of household ambulator General Gait Details: patient with occasional posterior loss of balance requiring min assist to correct. patient instructed to stop when he experienced a loss of balance prior to attempting to step again. max cueing for RW proximity and sequencing. chair follow utilized. SpO2 91% on RA during mobility.Seemingly good adherence to precautions.   Stairs             Wheelchair Mobility    Modified Rankin (Stroke Patients Only)       Balance Overall balance assessment: Needs assistance   Sitting balance-Leahy Scale: Fair     Standing balance support: Bilateral upper extremity supported Standing balance-Leahy Scale: Poor                              Cognition Arousal/Alertness: Awake/alert Behavior During Therapy: WFL for tasks assessed/performed Overall Cognitive Status: Impaired/Different from baseline Area of Impairment: Safety/judgement;Problem solving                         Safety/Judgement: Decreased awareness of deficits;Decreased awareness of safety   Problem Solving: Difficulty sequencing;Requires verbal cues General Comments: poor awareness of safety      Exercises General Exercises - Lower Extremity Long Arc Quad: AAROM;10 reps;Right Hip ABduction/ADduction: 15 reps;Seated;Other (comment)(isometric) Hip Flexion/Marching: Both;20 reps;Seated    General Comments General comments (skin integrity, edema, etc.): wife and daughter present  during session      Pertinent Vitals/Pain Pain Assessment: No/denies pain    Home Living Family/patient expects to be discharged to:: Private residence Living Arrangements: Spouse/significant other Available Help at  Discharge: Family;Available 24 hours/day Type of Home: House Home Access: Stairs to enter Entrance Stairs-Rails: Right;Left;Can reach both Home Layout: One level Home Equipment: Cane - single point;Walker - 4 wheels;Walker - 2 wheels      Prior Function Level of Independence: Independent with assistive device(s)      Comments: pt reported that he ambulated with RW and was independent with ADLs   PT Goals (current goals can now be found in the care plan section) Acute Rehab PT Goals Patient Stated Goal: return home tomorrow PT Goal Formulation: With patient/family Time For Goal Achievement: 11/21/17 Potential to Achieve Goals: Good Progress towards PT goals: Progressing toward goals    Frequency    Min 3X/week      PT Plan      Co-evaluation              AM-PAC PT "6 Clicks" Daily Activity  Outcome Measure  Difficulty turning over in bed (including adjusting bedclothes, sheets and blankets)?: Unable Difficulty moving from lying on back to sitting on the side of the bed? : Unable Difficulty sitting down on and standing up from a chair with arms (e.g., wheelchair, bedside commode, etc,.)?: Unable Help needed moving to and from a bed to chair (including a wheelchair)?: A Lot Help needed walking in hospital room?: A Lot Help needed climbing 3-5 steps with a railing? : Total 6 Click Score: 8    End of Session Equipment Utilized During Treatment: Gait belt Activity Tolerance: Patient limited by pain;Patient limited by fatigue Patient left: in chair;with call bell/phone within reach;with chair alarm set;with family/visitor present Nurse Communication: Mobility status PT Visit Diagnosis: Other abnormalities of gait and mobility (R26.89)     Time: [id number] PT Time Calculation (min) (ACUTE ONLY): 26 min  Charges:  $Gait Training: 23-37 mins                    G Codes:     Laurina Bustlearoline Sarahlynn Cisnero, PT, DPT Acute Rehabilitation Services  Pager: [phone]    Vanetta MuldersCarloine H  Rosangelica Pevehouse 11/08/2017, 3:52 PM

## 2017-11-08 NOTE — Clinical Social Work Note (Signed)
Clinical Social Work Assessment  Patient Details  Name: Jimmy MaceKenneth Strupp MRN: [id number] Date of Birth: [birth year]  Date of referral:  11/08/17               Reason for consult:  Facility Placement                Permission sought to share information with:  Family Supports Permission granted to share information::     Name::     Patent examineratsy  Agency::  [facility]  Relationship::  Spouse  Contact Information:     Housing/Transportation Living arrangements for the past 2 months:  Single Family Home Source of Information:  Patient, Adult Children, Spouse Patient Interpreter Needed:  None Criminal Activity/Legal Involvement Pertinent to Current Situation/Hospitalization:  No - Comment as needed Significant Relationships:  Adult Children, Spouse Lives with:  Spouse Do you feel safe going back to the place where you live?  Yes Need for family participation in patient care:  Yes (Comment)([facility]  Care giving concerns:  Pt is alert and oriented. Pt is hard of hearing. Pt's spouse and pt's daughter were present at bedside. Pt lives with spouse-- No additional caregivers noted at this time.   Social Worker assessment / plan:  CSW spoke with pt and pt's family at bedside. Pt was sitting in chair and presented awake. Pt did not appear to be in a disturbed emotional state, pt remained calm and allowed for the family to engage in conversation. At this time pt's family is aware of recommendation for SNF however, states that they think psychologically pt will be better off going home with home health--pt's [facility] pt would only get PT 2-3 times a week versus everyday at SNF. Pt lives with spouse and spouse is physically and mentally able to assist in pt care. Pt's daughter also serves as pt support. Pt's family inquiring about PT evaluating pt today in hopes of improvement to enable the pt to [facility]. In the case that pt does d/c to SNF family is requesting 1)  [facility], 2) [facility].  CSW spoke with PT and they are to evaluate pt again today with the families wishes in mind. CSW will do f/o--per pt's families permission for SNF backup.   Employment status:  Retired Database administratornsurance information:  Managed Medicare PT Recommendations:  Skilled Nursing Facility(Pending additional eval) Information / Referral to community resources:  Skilled Nursing Facility  Patient/Family's Response to care:  Pt's spouse verbalized understanding of CSW role and expressed appreciation for support. Pt's spouse requesting PT evaluate pt again to determine improvements with the hopes that pt can d/c home with HH--CSW spoke with PT to relay this information, PT to evaluate today.  Patient/Family's Understanding of and Emotional Response to Diagnosis, Current Treatment, and Prognosis:  Pt's spouse understanding and realistic regarding pt's physical limitations. Pt's spouse states pt was moving around with family in the room yesterday following PT eval and seemed to do much better. Pt's spouse understands the need for SNF placement at d/c--if pt is unable to [facility]. Pt's spouse denies any concern regarding treatment plan at this time--pending PT eval. CSW will continue to provide support and facilitate d/c needs.   Emotional Assessment Appearance:  Appears stated age Attitude/Demeanor/Rapport:  (Patient was appropriate. Pt heard of hearing) Affect (typically observed):  Accepting, Appropriate, Calm Orientation:  Oriented to Self, Oriented to Place, Oriented to  Time, Oriented to Situation Alcohol / Substance  use:  Not Applicable Psych involvement (Current and /or in the community):  No (Comment)  Discharge Needs  Concerns to be addressed:  Basic Needs, Care Coordination Readmission within the last 30 days:  No Current discharge risk:  Dependent with Mobility Barriers to Discharge:  Continued Medical Work up, [employer],  LCSW 11/08/2017, 9:43 AM

## 2017-11-08 NOTE — Progress Notes (Signed)
Internal Medicine Attending:   I saw and examined the patient. I reviewed the resident's note and I agree with the resident's findings and plan as documented in the resident's note.  Doing well today, pain is controlled, spirits are high. He is requiring moderate assist with all transfers and ADLs. He was in the chair most of yesterday. We are waiting for SNF placement to do more rehab. Family wants to take patient to the beach in one week, so I encouraged doing as much rehab as possible between now and then.

## 2017-11-09 ENCOUNTER — Encounter (HOSPITAL_COMMUNITY): Payer: Self-pay | Admitting: Student

## 2017-11-09 ENCOUNTER — Encounter
Admission: RE | Admit: 2017-11-09 | Discharge: 2017-11-09 | Disposition: A | Discharge status: Home / Self Care | Payer: Medicare HMO | Source: Ambulatory Visit | Attending: Internal Medicine | Admitting: Internal Medicine

## 2017-11-09 DIAGNOSIS — W19XXXA Unspecified fall, initial encounter: Secondary | ICD-10-CM

## 2017-11-09 LAB — CBC
HEMATOCRIT: 31.1 % — AB (ref 39.0–52.0)
Hemoglobin: 10.5 g/dL — ABNORMAL LOW (ref 13.0–17.0)
MCH: 33.2 pg (ref 26.0–34.0)
MCHC: 33.8 g/dL (ref 30.0–36.0)
MCV: 98.4 fL (ref 78.0–100.0)
PLATELETS: 87 10*3/uL — AB (ref 150–400)
RBC: 3.16 MIL/uL — ABNORMAL LOW (ref 4.22–5.81)
RDW: 13.7 % (ref 11.5–15.5)
WBC: 5.7 10*3/uL (ref 4.0–10.5)

## 2017-11-09 LAB — PROTIME-INR
INR: 1.44
Prothrombin Time: 17.5 seconds — ABNORMAL HIGH (ref 11.4–15.2)

## 2017-11-09 LAB — GLUCOSE, CAPILLARY
Glucose-Capillary: 77 mg/dL (ref 65–99)
Glucose-Capillary: 85 mg/dL (ref 65–99)
Glucose-Capillary: 101 mg/dL — ABNORMAL HIGH (ref 65–99)

## 2017-11-09 MED ORDER — WARFARIN SODIUM 5 MG PO TABS
5.0000 mg | ORAL_TABLET | Freq: Once | ORAL | Status: AC
  Administered 2017-11-09: 5 mg via ORAL
  Filled 2017-11-09: qty 1

## 2017-11-09 MED ORDER — POLYETHYLENE GLYCOL 3350 17 G PO PACK
17.0000 g | PACK | Freq: Two times a day (BID) | ORAL | Status: DC
  Administered 2017-11-09 – 2017-11-10 (×3): 17 g via ORAL
  Filled 2017-11-09 (×3): qty 1

## 2017-11-09 NOTE — Progress Notes (Signed)
   Subjective: [name] was seen resting in his bed this morning. He states he is feling okay today and his pain remains well controlled on current regimen. He and family member were informed that repeat PT evaluation was performed and recommendation remains to go to SNF for rehab. Patient and family are amenable to this as they realize that although he will miss their vacation, this is his best chance to rehab appropriately and help prevent future falls. Patient informed it will take a couple of days for insurance approval and SNF placement.  Objective:  Vital signs in last 24 hours: Vitals:   11/08/17 1455 11/08/17 1741 11/08/17 2100 11/09/17 0500  BP: 123/67 123/67 120/65 134/71  Pulse: 75 75 69 76  Resp:   14 15  Temp: 97.7 F (36.5 C)  97.6 F (36.4 C) 97.9 F (36.6 C)  TempSrc: Oral  Oral Oral  SpO2: 98%  96% 97%  Weight:      Height:       Physical Exam  Constitutional: He is oriented to person, place, and time. He appears well-developed and well-nourished. No distress.  Eyes: EOM are normal. Right eye exhibits no discharge. Left eye exhibits no discharge.  Cardiovascular: Normal rate, regular rhythm, normal heart sounds and intact distal pulses.  Pulmonary/Chest: Effort normal and breath sounds normal. No respiratory distress.  Abdominal: Soft. Bowel sounds are normal. He exhibits no distension. There is no tenderness.  Musculoskeletal:  RLE splinted and wrapped  Neurological: He is alert and oriented to person, place, and time.  Skin: Skin is warm and dry.   Assessment/Plan: 84 y.o male with CAD s/p CABG in 2004, HFpEF, CVD (L CEA in 1999 [facility] and right CEA occulusion), PAD, DM, HTN, and Chronic A-Fib who presented to the ED with right leg pain found to have displaced spiral tib-fib fracture s/p intramedullary nailing 6/21.   Right Displaced Spiral Tib-Fib Fracture Intramedullary nailing 6/21: Secondary to mechanical fall. No symptoms to suggest a  neurogenic or cardiac etiology. Pain controlled with current regimen.  > No BM yet, have increase bowl regimen to Miralax BID  > PT recommends SNF, patient agreeable - Pain control with tylenol, oxy IR prn - Consider Bisphosphonate at follow-up  Chronic Atrial Fibrillation: CHA2DS2-VASc Score of 8. Warfarin resumed 6/22 > INR 1.44 - Warfarin per pharmacy - Continue Metoprolol 25 mg BID for rate control  Thrombocytopenia:  Baseline in the 80s-130s per care everywhere. At baseline.  T2DM, with peripheral neuropathy: On glipizide 10 mg QD, Metformin 500 mg QD at home. On Lyrica 150mg  BID for neuropathy. > No recorded hypoglycemia overnight. -  SSI-S wc  HFpEF: Echo (06/2014): EF 55%, LVH with hyperlipomatosis of IAS. No valvular abnormalities noted; R atrial dilation. On metoprolol 25mg  BID, quinapril 5mg  daily and furosemide 40mg  PRN at home.  - Continue Metoprolol 25 mg BID, Lisinopril 5 - Holding Lasix but resume should patient show sign of fluid retention  Diet: CM/HH DVT ppx: sq heparin > warfarin Code: FULL  Dispo: Anticipated discharge pending SNF placement.  Jimmy Lawrence, Alexander, MD 11/09/2017, 7:29 AM Pager: [phone]

## 2017-11-09 NOTE — Progress Notes (Addendum)
ANTICOAGULATION CONSULT NOTE  Pharmacy Consult:  Coumadin Indication: atrial fibrillation  Allergies  Allergen Reactions  . Penicillins Hives    Tolerated cefazolin in June 2019.  Has patient had a PCN reaction causing immediate rash, facial/tongue/throat swelling, SOB or lightheadedness with hypotension: No Has patient had a PCN reaction causing severe rash involving mucus membranes or skin necrosis: No Has patient had a PCN reaction that required hospitalization: No Has patient had a PCN reaction occurring within the last 10 years: No If all of the above answers are "NO", then may proceed with Cephalosporin use.    Patient Measurements: Height: 5\' 7"  (170.2 cm) Weight: 185 lb (83.9 kg) IBW/kg (Calculated) : 66.1  Vital Signs: Temp: 97.9 F (36.6 C) (06/24 0500) Temp Source: Oral (06/24 0500) BP: 134/71 (06/24 0500) Pulse Rate: 76 (06/24 0500)  Labs: Recent Labs    11/08/17 0410 11/09/17 0358  HGB 10.0* 10.5*  HCT 29.8* 31.1*  PLT 78* 87*  LABPROT 18.4* 17.5*  INR 1.54 1.44    Estimated Creatinine Clearance: 51.7 mL/min (by C-G formula based on SCr of 1.12 mg/dL).   Assessment: 85 YOM on Coumadin PTA for Afib.  It was held for tib-fib fracture repair on 11/06/17 and then resumed on 11/07/17.  INR sub-therapeutic as expected.  Platelet count improving; no bleeding reported.  Home Coumadin dose: 5mg  daily except for 2.5mg  on MWF   Goal of Therapy:  INR 2-3 Monitor platelets by anticoagulation protocol: Yes    Plan:  Repeat Coumadin 5mg  PO today Daily PT / INR   Cassandre Oleksy D. Laney Potashang, PharmD, BCPS, BCCCP Pager:  [phone] 11/09/2017, 8:14 AM

## 2017-11-09 NOTE — Care Management Important Message (Signed)
Important Message  Patient Details  Name: Jimmy MaceKenneth Lawrence MRN: [id number] Date of Birth: May 16, 1933   Medicare Important Message Given:  Yes    Blu Lori Stefan ChurchBratton 11/09/2017, 3:59 PM

## 2017-11-09 NOTE — Discharge Instructions (Signed)

## 2017-11-09 NOTE — Progress Notes (Signed)
Patient seen and dressing changed. Incisions clean, dry and intact. May perform daily dressing changes at nursing facility. Continue WB restrictions. Return in 2 weeks with repeat x-rays. Okay to shower from orthopaedic standpoint.  Roby LoftsKevin P. Brandis Wixted, MD [facility] [phone] (phone)

## 2017-11-09 NOTE — Progress Notes (Signed)
Internal Medicine Attending:   I saw and examined the patient. I reviewed the resident's note and I agree with the resident's findings and plan as documented in the resident's note.  Patient feels well today with no new complaints.  Patient states that his pain is well controlled on his current regimen.  Patient was initially admitted for right tib-fib fracture status post mechanical fall.  He is now status post intramedullary nailing on June 21.  We will continue with current pain control regimen.  PT recommending SNF placement.  Social work follow-up appreciated.  Patient will need to obtain insurance authorization prior to discharge to SNF.  Patient will likely need a bisphosphonate started as an outpatient after follow-up with his PCP.  Patient has history of A. fib and his INR is now subtherapeutic as we held his warfarin for his surgery.  We will continue with warfarin per pharmacy.  Thrombocytopenia stable.  No further work-up at this time.  Patient is stable for discharge to SNF once bed is available.

## 2017-11-09 NOTE — Social Work (Signed)
CSW f/u for disposition.  CSW met with family/patient at bedside to discuss SNF offers. Family accepted SNF bed at [facility].  CSW will f/u with SNF on Initiating Insurance Auth.  Elissa Hefty, LCSW Clinical Social Worker [phone]

## 2017-11-09 NOTE — Discharge Summary (Addendum)
Name: Jimmy Lawrence MRN: [id number] DOB: Aug 24, 1932 84 y.o. PCP: Jimmy Sato, MD  Date of Admission: 11/05/2017  3:48 PM Date of Discharge: 11/10/17 Attending Physician: Jimmy Lagos, MD  Discharge Diagnosis:  1. Right Displaced Tib-Fib Fracture s/p ORIF  Discharge Medications: Allergies as of 11/10/2017      Reactions   Penicillins Hives   Tolerated cefazolin in June 2019. Has patient had a PCN reaction causing immediate rash, facial/tongue/throat swelling, SOB or lightheadedness with hypotension: No Has patient had a PCN reaction causing severe rash involving mucus membranes or skin necrosis: No Has patient had a PCN reaction that required hospitalization: No Has patient had a PCN reaction occurring within the last 10 years: No If all of the above answers are "NO", then may proceed with Cephalosporin use.      Medication List    TAKE these medications   acetaminophen 500 MG tablet Commonly known as:  TYLENOL Take 1,000 mg by mouth every 6 (six) hours as needed for headache (pain). What changed:  Another medication with the same name was added. Make sure you understand how and when to take each.   acetaminophen 325 MG tablet Commonly known as:  TYLENOL Take 2 tablets (650 mg total) by mouth every 6 (six) hours as needed for mild pain (or Fever >/= 101). What changed:  You were already taking a medication with the same name, and this prescription was added. Make sure you understand how and when to take each.   albuterol 108 (90 Base) MCG/ACT inhaler Commonly known as:  PROVENTIL HFA;VENTOLIN HFA Inhale 1 puff into the lungs every 6 (six) hours as needed for wheezing or shortness of breath.   clopidogrel 75 MG tablet Commonly known as:  PLAVIX Take 75 mg by mouth daily with breakfast.   Fish Oil 1200 MG Caps Take 1,200 mg by mouth daily with supper.   fluticasone 50 MCG/ACT nasal spray Commonly known as:  FLONASE Place 2 sprays into the nose 2 (two) times  daily as needed (seasonal allergies).   furosemide 40 MG tablet Commonly known as:  LASIX Take 40 mg by mouth daily as needed for fluid or edema (weight gain of 3 lbs over a couple days).   glipiZIDE 10 MG 24 hr tablet Commonly known as:  GLUCOTROL XL Take 10 mg by mouth daily with breakfast.   loperamide 2 MG capsule Commonly known as:  IMODIUM Take 2 mg by mouth every 4 (four) hours as needed for diarrhea or loose stools.   loratadine 10 MG tablet Commonly known as:  CLARITIN Take 10 mg by mouth daily as needed (seasonal allergies).   lovastatin 40 MG tablet Commonly known as:  MEVACOR Take 40 mg by mouth daily with supper.   magnesium oxide 400 MG tablet Commonly known as:  MAG-OX Take 400 mg by mouth daily with breakfast.   meclizine 25 MG tablet Commonly known as:  ANTIVERT Take 25 mg by mouth 3 (three) times daily as needed for dizziness.   metFORMIN 500 MG 24 hr tablet Commonly known as:  GLUCOPHAGE-XR Take 500 mg by mouth daily with supper.   metoprolol tartrate 25 MG tablet Commonly known as:  LOPRESSOR Take 25 mg by mouth 2 (two) times daily with a meal.   nitroGLYCERIN 0.4 MG SL tablet Commonly known as:  NITROSTAT Place 0.4 mg under the tongue every 5 (five) minutes as needed for chest pain.   polyethylene glycol packet Commonly known as:  MIRALAX / GLYCOLAX Take  17 g by mouth 2 (two) times daily.   potassium chloride SA 20 MEQ tablet Commonly known as:  K-DUR,KLOR-CON Take 20 mEq by mouth daily with breakfast.   pregabalin 150 MG capsule Commonly known as:  LYRICA Take 150 mg by mouth 2 (two) times daily with a meal.   quinapril 5 MG tablet Commonly known as:  ACCUPRIL Take 5 mg by mouth daily with breakfast.   traMADol 50 MG tablet Commonly known as:  ULTRAM Take 1 tablet (50 mg total) by mouth every 6 (six) hours as needed for up to 5 days for moderate pain or severe pain.   ULORIC 40 MG tablet Generic drug:  febuxostat Take 40 mg by  mouth daily with supper.   warfarin 5 MG tablet Commonly known as:  COUMADIN Take 2.5-5 mg by mouth See admin instructions. Take 1/2 tablet (2.5 mg) by mouth on Monday, Wednesday, Friday with supper; take 1 tablet (5 mg) on Sunday, Tuesday, Thursday, Saturday with supper       Disposition and follow-up:   [name] was discharged from [facility] in stable condition.  At the hospital follow up visit please address:  1. Right Tib/Fib Fracture s/p ORIF: Pt discharged to SNF for physical therapy. He has a 25% weight-bearing restriction on his RLE which is to be continued at discharge. Orthopaedics recommended 2-week follow-up in their office for repeat imaging; please ensure patient has this appointment. The patient would also benefit from BISPHOSPHONATE initiation several weeks after acute fracture. Discharged with rx for prn Oxycodone and tylenol. Will need bowel regimen to combat opioid-related constipation.   PAF: INR 1.5 at time of discharge. Please continue Coumadin and monitor INR daily. Goal 2-3.   2.  Labs / imaging needed at time of follow-up: INR  3.  Pending labs/ test needing follow-up: none  Follow-up Appointments:  Contact information for follow-up providers    Haddix, Gillie Manners, MD. Schedule an appointment as soon as possible for a visit in 2 week(s).   Specialty:  Orthopedic Surgery Contact information: [address] [place] [address] [place] [address] [phone]            Contact information for after-discharge care    Destination    [facility] .   Service:  Skilled Nursing Contact information: [address] [place] [address] [phone]                  Hospital Course by problem list: 1. Right Displaced Tib-Fib Fracture s/p ORIF 6/21 Very pleasant 84 year-old male with CAD s/p CABG in 2004, HFpEF, CVD (L CEA in 1999 complicated by [facility] and right CEA occulusion), PAD, DM, HTN, and Chronic  A-Fib who presented to the ED 11/05/17 with right leg pain after a mechanical fall. He was found to have a Right Displaced Spiral Tibia/Fibula Fracture and underwent ORIF 6/21 which was without immediate complications. Ortho recommended 25% weight bearing restriction and orthopedic follow-up in 2 weeks for repeat X-rays. Pain controlled with PO Oxycodone and tylenol. He is being discharged to SNF for rehabilitation. Prior to admission Mr. Bannan was mostly independent and ambulated with assistance of walker and cane and hopefully will be able to return to baseline with PT. Please consider addition of a Bisphosphonate at follow-up.   PAF Warfarin held due to surgery 6/21; this was resumed 6/22. INR 1.5 at time of discharge with goal of 2-3. This will need to be monitored at his facility. He  was continued on Metoprolol 25mg  BID for rate control.   HFpEF Home lasix was held during admission however resumed at discharge. Patient was without orthopnea and did not appear volume-overloaded at DC.  DM2 Patient on Metformin 500mg  daily at home and also takes Lyrica 150mg  for peripheral neuropathy. Metformin held on admission and he required minimal sliding-scale insulin. Metformin and Lyrica to be continued at discharge.   Discharge Vitals:   BP (!) 143/46 (BP Location: Right Arm)   Pulse 97   Temp 98.5 F (36.9 C) (Oral)   Resp 15   Ht 5\' 7"  (1.702 m)   Wt 185 lb (83.9 kg)   SpO2 98%   BMI 28.98 kg/m   Pertinent Labs, Studies, and Procedures:  CBC Latest Ref Rng & Units 11/10/2017 11/09/2017 11/08/2017  WBC 4.0 - 10.5 K/uL 7.7 5.7 7.1  Hemoglobin 13.0 - 17.0 g/dL 11.9(L) 10.5(L) 10.0(L)  Hematocrit 39.0 - 52.0 % 35.4(L) 31.1(L) 29.8(L)  Platelets 150 - 400 K/uL 104(L) 87(L) 78(L)   11/05/17 DG Tibia/Fibula Right: Mildly displaced fracture proximal fibula. Displaced spiral fracture distal tibia. 11/05/17 DG Ankle Complete Right: Spiral fracture distal tibia not involving the ankle joint. 11/05/17  CXR: Cardiomegaly with mild vascular congestion and mild interstitial edema. 11/06/17 DG Tibia/Fibula Right: Status post ORIF of right tibial fracture. Position and alignment are improved. No acute abnormality. 11/06/17 RIGHT ORIF TIBIA/FIBULA - Dr. Kirtland BouchardK Haddix. No obvious complications.   Signed: Beola CordMelvin, Alexander, MD 11/10/2017, 11:19 AM   Pager: [phone]

## 2017-11-10 LAB — CBC
HEMATOCRIT: 35.4 % — AB (ref 39.0–52.0)
Hemoglobin: 11.9 g/dL — ABNORMAL LOW (ref 13.0–17.0)
MCH: 33.3 pg (ref 26.0–34.0)
MCHC: 33.6 g/dL (ref 30.0–36.0)
MCV: 99.2 fL (ref 78.0–100.0)
Platelets: 104 10*3/uL — ABNORMAL LOW (ref 150–400)
RBC: 3.57 MIL/uL — AB (ref 4.22–5.81)
RDW: 13.8 % (ref 11.5–15.5)
WBC: 7.7 10*3/uL (ref 4.0–10.5)

## 2017-11-10 LAB — GLUCOSE, CAPILLARY
GLUCOSE-CAPILLARY: 141 mg/dL — AB (ref 70–99)
Glucose-Capillary: 143 mg/dL — ABNORMAL HIGH (ref 70–99)
Glucose-Capillary: 119 mg/dL — ABNORMAL HIGH (ref 70–99)

## 2017-11-10 LAB — PROTIME-INR
INR: 1.45
Prothrombin Time: 17.6 seconds — ABNORMAL HIGH (ref 11.4–15.2)

## 2017-11-10 MED ORDER — FUROSEMIDE 40 MG PO TABS: 40.0000 mg | ORAL_TABLET | Freq: Every day | ORAL | Status: DC | PRN

## 2017-11-10 MED ORDER — BISACODYL 10 MG RE SUPP
10.0000 mg | Freq: Once | RECTAL | Status: AC
  Administered 2017-11-10: 10 mg via RECTAL
  Filled 2017-11-10: qty 1

## 2017-11-10 MED ORDER — TRAMADOL HCL 50 MG PO TABS: 50.0000 mg | ORAL_TABLET | Freq: Four times a day (QID) | ORAL | 0 refills | Status: AC | PRN

## 2017-11-10 MED ORDER — POLYETHYLENE GLYCOL 3350 17 G PO PACK: 17.0000 g | PACK | Freq: Two times a day (BID) | ORAL | 0 refills | Status: AC

## 2017-11-10 MED ORDER — WARFARIN SODIUM 7.5 MG PO TABS: 7.5000 mg | ORAL_TABLET | Freq: Once | ORAL | Status: DC

## 2017-11-10 MED ORDER — OXYCODONE HCL 5 MG PO TABS: 5.0000 mg | ORAL_TABLET | ORAL | 0 refills | Status: DC | PRN

## 2017-11-10 MED ORDER — TRAMADOL HCL 50 MG PO TABS
50.0000 mg | ORAL_TABLET | Freq: Four times a day (QID) | ORAL | Status: DC | PRN
  Filled 2017-11-10: qty 1

## 2017-11-10 MED ORDER — SENNOSIDES-DOCUSATE SODIUM 8.6-50 MG PO TABS
2.0000 | ORAL_TABLET | Freq: Two times a day (BID) | ORAL | Status: DC
  Administered 2017-11-10: 2 via ORAL
  Filled 2017-11-10: qty 2

## 2017-11-10 MED ORDER — ACETAMINOPHEN 325 MG PO TABS: 650.0000 mg | ORAL_TABLET | Freq: Four times a day (QID) | ORAL | Status: AC | PRN

## 2017-11-10 NOTE — Clinical Social Work Placement (Signed)
   CLINICAL SOCIAL WORK PLACEMENT  NOTE  Date:  11/10/2017  Patient Details  Name: Jimmy Lawrence MRN: [id number] Date of Birth: 1932-06-12  Clinical Social Work is seeking post-discharge placement for this patient at the Skilled  Nursing Facility level of care (*CSW will initial, date and re-position this form in  chart as items are completed):  Yes   Patient/family provided with [facility] Clinical Social Work Department's list of facilities offering this level of care within the geographic area requested by the patient (or if unable, by the patient's family).  Yes   Patient/family informed of their freedom to choose among providers that offer the needed level of care, that participate in Medicare, Medicaid or managed care program needed by the patient, have an available bed and are willing to accept the patient.  Yes   Patient/family informed of [facility]'s ownership interest in [facility] and [facility], as well as of the fact that they are under no obligation to receive care at these facilities.  PASRR submitted to EDS on       PASRR number received on       Existing PASRR number confirmed on       FL2 transmitted to all facilities in geographic area requested by pt/family on       FL2 transmitted to all facilities within larger geographic area on       Patient informed that his/her managed care company has contracts with or will negotiate with certain facilities, including the following:        Yes   Patient/family informed of bed offers received.  [facility]     Physician recommends and patient chooses bed at      [facility]  Patient to be transferred to facility by [facility]     Patient family notified on 11/10/17 of transfer.  Name of family member notified:  spouse at bedside     PHYSICIAN       Additional Comment:    _______________________________________________ Tresa MoorePatricia V  Danarius Mcconathy, LCSW 11/10/2017, 10:54 AM

## 2017-11-10 NOTE — Progress Notes (Signed)
Internal Medicine Attending:   I saw and examined the patient. I reviewed the resident's note and I agree with the resident's findings and plan as documented in the resident's note.  Patient feels well today has no new complaints.  He states that his pain is well controlled on current pain medications.  He still has yet to have a bowel movement.  Patient was initially admitted with a right tib-fib fracture status post mechanical fall.  He is now status post intramedullary nailing on June 21.  We will continue pain control for now.  He will likely need bisphosphonate on follow-up.  No further work-up at this time.  Patient stable for SNF discharge today if bed available.  Of note, patient has chronic atrial fibrillation on anti-coagulation with warfarin.  His INR is subtherapeutic and will need follow-up at SNF.

## 2017-11-10 NOTE — Progress Notes (Signed)
Pt did not have BM for 4 days. Laxatives and suppository was given. Pt had a BM today. Report was given to Best boyTabatha RN [facility]. A copy of discharge instructions was given to family. Discharged pt to SNF via [facility].

## 2017-11-10 NOTE — Progress Notes (Signed)
ANTICOAGULATION CONSULT NOTE  Pharmacy Consult:  Coumadin Indication: atrial fibrillation  Allergies  Allergen Reactions  . Penicillins Hives    Tolerated cefazolin in June 2019.  Has patient had a PCN reaction causing immediate rash, facial/tongue/throat swelling, SOB or lightheadedness with hypotension: No Has patient had a PCN reaction causing severe rash involving mucus membranes or skin necrosis: No Has patient had a PCN reaction that required hospitalization: No Has patient had a PCN reaction occurring within the last 10 years: No If all of the above answers are "NO", then may proceed with Cephalosporin use.    Patient Measurements: Height: 5\' 7"  (170.2 cm) Weight: 185 lb (83.9 kg) IBW/kg (Calculated) : 66.1  Vital Signs: Temp: 98.5 F (36.9 C) (06/25 0450) Temp Source: Oral (06/25 0450) BP: 143/46 (06/25 0450) Pulse Rate: 97 (06/25 0450)  Labs: Recent Labs    11/08/17 0410 11/09/17 0358 11/10/17 0625  HGB 10.0* 10.5* 11.9*  HCT 29.8* 31.1* 35.4*  PLT 78* 87* 104*  LABPROT 18.4* 17.5* 17.6*  INR 1.54 1.44 1.45    Estimated Creatinine Clearance: 51.7 mL/min (by C-G formula based on SCr of 1.12 mg/dL).   Assessment: 85 YOM on Coumadin PTA for Afib.  It was held for tib-fib fracture repair on 11/06/17 and then resumed on 11/07/17.  INR sub-therapeutic as expected.  Platelet count improving; no bleeding reported.  Home Coumadin dose: 5mg  daily except for 2.5mg  on MWF   Goal of Therapy:  INR 2-3 Monitor platelets by anticoagulation protocol: Yes    Plan:  Coumadin 7.5mg  PO today Daily PT / INR   Daley Mooradian D. Laney Potashang, PharmD, BCPS, BCCCP Pager:  [phone] 11/10/2017, 8:16 AM

## 2017-11-10 NOTE — Progress Notes (Signed)
Physical Therapy Treatment Patient Details Name: Jimmy Lawrence MRN: [id number] DOB: Jun 17, 1932 Today's Date: 11/10/2017    History of Present Illness Pt is an 84 y/o male with PMH of  CAD s/p CABG in 2004, HFpEF, CVD (L CEA in 1999 complicated by [facility] and right CEA occulusion), PAD, DM, HTN, and Chronic A-Fib who presented to the ED with right leg pain found to have displaced spiral tib-fib fracture s/p intramedullary nailing 6/21.    PT Comments    Pt already up and toileting on arrival to room. Able to stand with mod A to perform peri care before ambulating 20 ft. Min A required for RW management as pt demonstrates decreased awareness of safety. Pt continues to remain appropriate for SNF to maximize functional independence and safety with mobility. Will continue to follow acutely.     Follow Up Recommendations  SNF     Equipment Recommendations  3in1 (PT);Wheelchair (measurements PT);Wheelchair cushion (measurements PT);Other (comment)(w/c with elevating leg rests)    Recommendations for Other Services       Precautions / Restrictions Precautions Precautions: Fall Restrictions Weight Bearing Restrictions: Yes RLE Weight Bearing: Partial weight bearing RLE Partial Weight Bearing Percentage or Pounds: 25    Mobility  Bed Mobility               General bed mobility comments: toileting on arrival  Transfers Overall transfer level: Needs assistance Equipment used: Rolling walker (2 wheeled) Transfers: Sit to/from Stand Sit to Stand: Mod assist(x2)         General transfer comment: Pt pulling up on grab bar to rise into standing. Increased time and effort required with cues for hand placement once standing. Appeare to adhear to WB precautions  Ambulation/Gait Ambulation/Gait assistance: Min assist Gait Distance (Feet): 20 Feet Assistive device: Rolling walker (2 wheeled) Gait Pattern/deviations: Step-to pattern;Decreased step length - left;Decreased stance time  - right;Decreased weight shift to right;Antalgic;Trunk flexed Gait velocity: decreased   General Gait Details: Cues for postural control. Pt with good adhearence to WB precautions. 1 LOB, no assist required to correct. Min A for RW management. Chair to follow as pt fatigues quickly.   Stairs             Wheelchair Mobility    Modified Rankin (Stroke Patients Only)       Balance Overall balance assessment: Needs assistance Sitting-balance support: No upper extremity supported;Feet supported Sitting balance-Leahy Scale: Fair     Standing balance support: Bilateral upper extremity supported Standing balance-Leahy Scale: Poor Standing balance comment: BUE support throughout all standing tasks.                             Cognition Arousal/Alertness: Awake/alert Behavior During Therapy: WFL for tasks assessed/performed Overall Cognitive Status: Impaired/Different from baseline Area of Impairment: Safety/judgement;Problem solving                         Safety/Judgement: Decreased awareness of deficits;Decreased awareness of safety Awareness: Intellectual Problem Solving: Difficulty sequencing;Requires verbal cues General Comments: Hard of hearing, making following commands challanging. Pt with poor safety awareness and requires max multimodal cues for safety with mobility.      Exercises      General Comments General comments (skin integrity, edema, etc.): wift and daughter present during session.      Pertinent Vitals/Pain Pain Assessment: No/denies pain    Home Living  Prior Function            PT Goals (current goals can now be found in the care plan section) Acute Rehab PT Goals Patient Stated Goal: return home tomorrow PT Goal Formulation: With patient/family Time For Goal Achievement: 11/21/17 Potential to Achieve Goals: Good Progress towards PT goals: Progressing toward goals    Frequency     Min 3X/week      PT Plan Current plan remains appropriate    Co-evaluation              AM-PAC PT "6 Clicks" Daily Activity  Outcome Measure  Difficulty turning over in bed (including adjusting bedclothes, sheets and blankets)?: Unable Difficulty moving from lying on back to sitting on the side of the bed? : Unable Difficulty sitting down on and standing up from a chair with arms (e.g., wheelchair, bedside commode, etc,.)?: Unable Help needed moving to and from a bed to chair (including a wheelchair)?: A Little Help needed walking in hospital room?: A Little Help needed climbing 3-5 steps with a railing? : Total 6 Click Score: 10    End of Session Equipment Utilized During Treatment: Gait belt Activity Tolerance: Patient tolerated treatment well Patient left: in chair;with call bell/phone within reach;with family/visitor present Nurse Communication: Mobility status PT Visit Diagnosis: Other abnormalities of gait and mobility (R26.89)     Time: [id number] PT Time Calculation (min) (ACUTE ONLY): 15 min  Charges:  $Gait Training: 8-22 mins                    G Codes:       Kallie LocksHannah Kindsey Eblin, VirginiaPTA Pager [phone] Acute Rehab   Sheral ApleyHannah E Elson Ulbrich 11/10/2017, 1:05 PM

## 2017-11-10 NOTE — Social Work (Signed)
CSW f/u on disposition.  CSW discussed Insurance auth and barriers as SNF has not received Auth yet.  SNF declined to accept LOG yesterday [facility] CSW discussed with Dr. Jacky KindleAronson and Clinical Director and they indicated SNF will take LOG. CSW contacted SNF and they indicated they can take patient today with letter of guarantee while Insurance Auth is pending.  CSW awaiting DC summary for [facility].  Keene BreathPatricia Yudith Norlander, LCSW Clinical Social Worker [phone]

## 2017-11-10 NOTE — Progress Notes (Addendum)
   Subjective: Jimmy Lawrence was seen resting in his bed this morning. He states he is feeling okay and his pain remains well controlled. He has selected a rehab facility and has a bed but has been awaiting insurance authorization. He states he is ready to get started with rehab so he can return home sooner. He has no further questions or complaints this AM.  Objective:  Vital signs in last 24 hours: Vitals:   11/09/17 0837 11/09/17 1400 11/09/17 2051 11/10/17 0450  BP: (!) 112/59 107/68 128/63 (!) 143/46  Pulse: 62 61 61 97  Resp:      Temp:  98.2 F (36.8 C) 98.2 F (36.8 C) 98.5 F (36.9 C)  TempSrc:  Oral Oral Oral  SpO2:  99% 96% 98%  Weight:      Height:       Physical Exam  Constitutional: He is oriented to person, place, and time. He appears well-developed and well-nourished. No distress.  Eyes: EOM are normal. Right eye exhibits no discharge. Left eye exhibits no discharge.  Cardiovascular: Normal rate, regular rhythm, normal heart sounds and intact distal pulses.  Pulmonary/Chest: Effort normal and breath sounds normal. No respiratory distress.  Abdominal: Soft. Bowel sounds are normal. He exhibits no distension. There is no tenderness.  Musculoskeletal: He exhibits no edema.  RLE in boot Visible wounds are clean and dry  Neurological: He is alert and oriented to person, place, and time.  Skin: Skin is warm and dry.   Assessment/Plan: 84 y.o male with CAD s/p CABG in 2004, HFpEF, CVD [facility] PAD, DM, HTN, and Chronic A-Fib who presented to the ED with right leg pain found to have displaced spiral tib-fib fracture s/p intramedullary nailing 6/21.   Right Displaced Spiral Tib-Fib Fracture Intramedullary nailing 6/21: Improving. Secondary to mechanical fall. No symptoms to suggest a neurogenic or cardiac etiology. Pain controlled with current regimen.  > No BM yet, have increase bowl regimen to Miralax BID, Senna-Kot, and  Dulcolax Suppository  > PT recommends SNF, patient agreeable - Pain control with tylenol, oxy IR prn - Consider Bisphosphonate at follow-up  Chronic Atrial Fibrillation: CHA2DS2-VASc Score of 8. Warfarin resumed 6/22 > INR 1.45 - Warfarin per pharmacy - Continue Metoprolol 25 mg BID for rate control  Thrombocytopenia:  Baseline in the 80s-130s per care everywhere. At baseline.  T2DM, with peripheral neuropathy: On glipizide 10 mg QD, Metformin 500 mg QD at home. On Lyrica 150mg  BID for neuropathy. > No recorded hypoglycemia overnight. -  SSI-S wc  HFpEF: Echo (06/2014): EF 55%, LVH with hyperlipomatosis of IAS. No valvular abnormalities noted; R atrial dilation. On metoprolol 25mg  BID, quinapril 5mg  daily and furosemide 40mg  PRN at home.  - Continue Metoprolol 25 mg BID, Lisinopril 5 - Resume home lasix  Diet: CM/HH DVT ppx: Warfarin Code: FULL  Dispo: Anticipated discharge pending SNF placement and insurance authorization  Beola CordMelvin, Marlia Schewe, MD 11/10/2017, 10:09 AM Pager: [phone]

## 2017-11-10 NOTE — Social Work (Signed)
Clinical Social Worker facilitated patient discharge including contacting patient family and facility to confirm patient discharge plans.  Clinical information faxed to facility and family agreeable with plan.    CSW arranged [facility]    RN to call [phone] to give report prior to discharge. Pt is going to Room 204B.  Clinical Social Worker will sign off for now as social work intervention is no longer needed. Please consult us again if new need arises.  Keene BreathPatricia Evgenia Merriman, LCSW Clinical Social Worker [phone]

## 2017-11-11 ENCOUNTER — Other Ambulatory Visit: Payer: Self-pay

## 2017-11-11 MED ORDER — OXYCODONE HCL 5 MG PO TABS: 5.0000 mg | ORAL_TABLET | ORAL | 0 refills | Status: AC | PRN

## 2017-11-11 MED ORDER — PREGABALIN 150 MG PO CAPS: 150.0000 mg | ORAL_CAPSULE | Freq: Two times a day (BID) | ORAL | 0 refills | Status: AC

## 2017-11-11 NOTE — Telephone Encounter (Signed)
Rx sent to [facility] phone : [phone] , fax : [phone]  

## 2017-11-12 ENCOUNTER — Other Ambulatory Visit
Admission: RE | Admit: 2017-11-12 | Discharge: 2017-11-12 | Disposition: A | Discharge status: Home / Self Care | Payer: Medicare HMO | Source: Other Acute Inpatient Hospital | Attending: Gerontology | Admitting: Gerontology

## 2017-11-12 DIAGNOSIS — S82241D Displaced spiral fracture of shaft of right tibia, subsequent encounter for closed fracture with routine healing: Secondary | ICD-10-CM | POA: Insufficient documentation

## 2017-11-12 LAB — URINALYSIS, COMPLETE (UACMP) WITH MICROSCOPIC
Bacteria, UA: NONE SEEN
Bilirubin Urine: NEGATIVE
GLUCOSE, UA: NEGATIVE mg/dL
Hgb urine dipstick: NEGATIVE
KETONES UR: NEGATIVE mg/dL
LEUKOCYTES UA: NEGATIVE
NITRITE: NEGATIVE
PH: 6 (ref 5.0–8.0)
Protein, ur: NEGATIVE mg/dL
SPECIFIC GRAVITY, URINE: 1.019 (ref 1.005–1.030)
Squamous Epithelial / LPF: NONE SEEN (ref 0–5)

## 2017-11-12 LAB — COMPREHENSIVE METABOLIC PANEL
ALBUMIN: 3.6 g/dL (ref 3.5–5.0)
ALT: 20 U/L (ref 0–44)
ANION GAP: 10 (ref 5–15)
AST: 32 U/L (ref 15–41)
Alkaline Phosphatase: 94 U/L (ref 38–126)
BUN: 34 mg/dL — ABNORMAL HIGH (ref 8–23)
CHLORIDE: 104 mmol/L (ref 98–111)
CO2: 22 mmol/L (ref 22–32)
Calcium: 8.6 mg/dL — ABNORMAL LOW (ref 8.9–10.3)
Creatinine, Ser: 0.87 mg/dL (ref 0.61–1.24)
GFR calc Af Amer: >60 mL/min (ref >60)
GFR calc non Af Amer: >60 mL/min (ref >60)
GLUCOSE: 134 mg/dL — AB (ref 70–99)
Potassium: 4.4 mmol/L (ref 3.5–5.1)
SODIUM: 136 mmol/L (ref 135–145)
Total Bilirubin: 2.7 mg/dL — ABNORMAL HIGH (ref 0.3–1.2)
Total Protein: 6.7 g/dL (ref 6.5–8.1)

## 2017-11-12 LAB — PROTIME-INR
INR: 1.69
Prothrombin Time: 19.7 seconds — ABNORMAL HIGH (ref 11.4–15.2)

## 2017-11-12 LAB — CBC WITH DIFFERENTIAL/PLATELET
BASOS ABS: 0 10*3/uL (ref 0–0.1)
BASOS PCT: 0 %
EOS ABS: 0.1 10*3/uL (ref 0–0.7)
Eosinophils Relative: 2 %
HCT: 33.4 % — ABNORMAL LOW (ref 40.0–52.0)
Hemoglobin: 12 g/dL — ABNORMAL LOW (ref 13.0–18.0)
Lymphocytes Relative: 10 %
Lymphs Abs: 0.7 10*3/uL — ABNORMAL LOW (ref 1.0–3.6)
MCH: 34.6 pg — ABNORMAL HIGH (ref 26.0–34.0)
MCHC: 35.8 g/dL (ref 32.0–36.0)
MCV: 96.8 fL (ref 80.0–100.0)
MONO ABS: 0.8 10*3/uL (ref 0.2–1.0)
MONOS PCT: 11 %
NEUTROS PCT: 77 %
Neutro Abs: 5.5 10*3/uL (ref 1.4–6.5)
Platelets: 125 10*3/uL — ABNORMAL LOW (ref 150–440)
RBC: 3.45 MIL/uL — ABNORMAL LOW (ref 4.40–5.90)
RDW: 13.6 % (ref 11.5–14.5)
WBC: 7.2 10*3/uL (ref 3.8–10.6)

## 2017-11-13 LAB — URINE CULTURE: CULTURE: NO GROWTH

## 2017-11-14 ENCOUNTER — Other Ambulatory Visit
Admission: RE | Admit: 2017-11-14 | Discharge: 2017-11-14 | Disposition: A | Discharge status: Home / Self Care | Payer: Medicare HMO | Source: Ambulatory Visit | Attending: Family Medicine | Admitting: Family Medicine

## 2017-11-14 DIAGNOSIS — I4891 Unspecified atrial fibrillation: Secondary | ICD-10-CM | POA: Insufficient documentation

## 2017-11-14 LAB — PROTIME-INR
INR: 1.76
PROTHROMBIN TIME: 20.4 s — AB (ref 11.4–15.2)

## 2017-11-15 ENCOUNTER — Other Ambulatory Visit
Admission: RE | Admit: 2017-11-15 | Discharge: 2017-11-15 | Disposition: A | Discharge status: Home / Self Care | Payer: Medicare HMO | Source: Ambulatory Visit | Attending: Internal Medicine | Admitting: Internal Medicine

## 2017-11-15 DIAGNOSIS — R410 Disorientation, unspecified: Secondary | ICD-10-CM | POA: Insufficient documentation

## 2017-11-15 LAB — URINALYSIS, COMPLETE (UACMP) WITH MICROSCOPIC
Bacteria, UA: NONE SEEN
Bilirubin Urine: NEGATIVE
Glucose, UA: NEGATIVE mg/dL
Hgb urine dipstick: NEGATIVE
Ketones, ur: NEGATIVE mg/dL
Leukocytes, UA: NEGATIVE
Nitrite: NEGATIVE
Protein, ur: NEGATIVE mg/dL
Specific Gravity, Urine: 1.018 (ref 1.005–1.030)
pH: 5 (ref 5.0–8.0)

## 2017-11-16 ENCOUNTER — Other Ambulatory Visit
Admission: RE | Admit: 2017-11-16 | Discharge: 2017-11-16 | Disposition: A | Discharge status: Home / Self Care | Payer: Medicare HMO | Source: Ambulatory Visit | Attending: Internal Medicine | Admitting: Internal Medicine

## 2017-11-16 ENCOUNTER — Encounter
Admission: RE | Admit: 2017-11-16 | Discharge: 2017-11-16 | Disposition: A | Discharge status: Home / Self Care | Payer: Medicare HMO | Source: Ambulatory Visit | Attending: Internal Medicine | Admitting: Internal Medicine

## 2017-11-16 DIAGNOSIS — S82241D Displaced spiral fracture of shaft of right tibia, subsequent encounter for closed fracture with routine healing: Secondary | ICD-10-CM | POA: Insufficient documentation

## 2017-11-16 DIAGNOSIS — X58XXXD Exposure to other specified factors, subsequent encounter: Secondary | ICD-10-CM | POA: Diagnosis not present

## 2017-11-16 LAB — CBC WITH DIFFERENTIAL/PLATELET
BASOS ABS: 0 10*3/uL (ref 0–0.1)
Basophils Relative: 1 %
EOS ABS: 0.2 10*3/uL (ref 0–0.7)
EOS PCT: 3 %
HEMATOCRIT: 31.9 % — AB (ref 40.0–52.0)
Hemoglobin: 11.3 g/dL — ABNORMAL LOW (ref 13.0–18.0)
Lymphocytes Relative: 12 %
Lymphs Abs: 0.7 10*3/uL — ABNORMAL LOW (ref 1.0–3.6)
MCH: 34.6 pg — ABNORMAL HIGH (ref 26.0–34.0)
MCHC: 35.4 g/dL (ref 32.0–36.0)
MCV: 97.8 fL (ref 80.0–100.0)
MONO ABS: 0.6 10*3/uL (ref 0.2–1.0)
MONOS PCT: 10 %
NEUTROS ABS: 4.3 10*3/uL (ref 1.4–6.5)
Neutrophils Relative %: 74 %
PLATELETS: 155 10*3/uL (ref 150–440)
RBC: 3.26 MIL/uL — ABNORMAL LOW (ref 4.40–5.90)
RDW: 13.6 % (ref 11.5–14.5)
WBC: 5.8 10*3/uL (ref 3.8–10.6)

## 2017-11-16 LAB — PROTIME-INR
INR: 1.81
PROTHROMBIN TIME: 20.8 s — AB (ref 11.4–15.2)

## 2017-11-16 LAB — COMPREHENSIVE METABOLIC PANEL
ALT: 22 U/L (ref 0–44)
ANION GAP: 8 (ref 5–15)
AST: 37 U/L (ref 15–41)
Albumin: 4 g/dL (ref 3.5–5.0)
Alkaline Phosphatase: 117 U/L (ref 38–126)
BUN: 31 mg/dL — ABNORMAL HIGH (ref 8–23)
CO2: 26 mmol/L (ref 22–32)
CREATININE: 1.16 mg/dL (ref 0.61–1.24)
Calcium: 9.5 mg/dL (ref 8.9–10.3)
Chloride: 105 mmol/L (ref 98–111)
GFR, EST NON AFRICAN AMERICAN: 56 mL/min — AB (ref >60)
Glucose, Bld: 165 mg/dL — ABNORMAL HIGH (ref 70–99)
Potassium: 5.5 mmol/L — ABNORMAL HIGH (ref 3.5–5.1)
Sodium: 139 mmol/L (ref 135–145)
Total Bilirubin: 1.5 mg/dL — ABNORMAL HIGH (ref 0.3–1.2)
Total Protein: 7.2 g/dL (ref 6.5–8.1)

## 2017-11-18 ENCOUNTER — Non-Acute Institutional Stay (SKILLED_NURSING_FACILITY): Payer: Medicare HMO | Admitting: Adult Health

## 2017-11-18 ENCOUNTER — Encounter: Payer: Self-pay | Admitting: Adult Health

## 2017-11-18 DIAGNOSIS — S82201D Unspecified fracture of shaft of right tibia, subsequent encounter for closed fracture with routine healing: Secondary | ICD-10-CM

## 2017-11-18 DIAGNOSIS — I482 Chronic atrial fibrillation, unspecified: Secondary | ICD-10-CM

## 2017-11-18 DIAGNOSIS — I739 Peripheral vascular disease, unspecified: Secondary | ICD-10-CM | POA: Diagnosis not present

## 2017-11-18 DIAGNOSIS — S82401D Unspecified fracture of shaft of right fibula, subsequent encounter for closed fracture with routine healing: Secondary | ICD-10-CM | POA: Diagnosis not present

## 2017-11-18 LAB — URINE CULTURE

## 2017-11-18 NOTE — Progress Notes (Signed)
Location:    [facility] Room Number: [phone] Place of Service:  SNF (31)    CODE STATUS: FULL  Allergies  Allergen Reactions  . Penicillins Hives    Tolerated cefazolin in June 2019.  Has patient had a PCN reaction causing immediate rash, facial/tongue/throat swelling, SOB or lightheadedness with hypotension: No Has patient had a PCN reaction causing severe rash involving mucus membranes or skin necrosis: No Has patient had a PCN reaction that required hospitalization: No Has patient had a PCN reaction occurring within the last 10 years: No If all of the above answers are "NO", then may proceed with Cephalosporin use.    Chief Complaint  Patient presents with  . Discharge Note    Discharging 11/20/2017    HPI:   He is being discharged to home with home health for pt/ot. He will not need dme; he has all necessary dme at home. He odes have family support. He will need his prescriptions written and will need to follow up with his medical provider. He denies any changes in appetite; no uncontrolled pain; no insomnia. He had been hospitalized for a right tibial/fibia  fracture and is status post right tibial nail on 11-06-17. He was admitted to this facility for short term rehab.     Past Medical History:  Diagnosis Date  . CAD (coronary artery disease)   . Carotid artery stenosis, asymptomatic, bilateral    s/p CEA 1999  . PAD (peripheral artery disease) (HCC)   . Peripheral neuropathy   . SAH (subarachnoid hemorrhage) (HCC)    post CEA in 1999  . T2DM (type 2 diabetes mellitus) (HCC)     Past Surgical History:  Procedure Laterality Date  . CAROTID ENDARTERECTOMY  1999  . CHOLECYSTECTOMY    . CORONARY ARTERY BYPASS GRAFT    . HERNIA REPAIR     abdominal x2  . TIBIA IM NAIL INSERTION Right 11/06/2017   Procedure: INTRAMEDULLARY (IM) NAIL TIBIAL;  Surgeon: Roby LoftsHaddix, Kevin P, MD;  Location: [facility] OR;  Service: Orthopedics;  Laterality: Right;     Social History   Socioeconomic History  . Marital status: Married    Spouse name: Not on file  . Number of children: Not on file  . Years of education: Not on file  . Highest education level: Not on file  Occupational History  . Not on file  Social Needs  . Financial resource strain: Not on file  . Food insecurity:    Worry: Not on file    Inability: Not on file  . Transportation needs:    Medical: Not on file    Non-medical: Not on file  Tobacco Use  . Smoking status: Former Games developermoker  . Smokeless tobacco: Current User    Types: Chew  Substance and Sexual Activity  . Alcohol use: Not Currently  . Drug use: Never  . Sexual activity: Not on file  Lifestyle  . Physical activity:    Days per week: Not on file    Minutes per session: Not on file  . Stress: Not on file  Relationships  . Social connections:    Talks on phone: Not on file    Gets together: Not on file    Attends religious service: Not on file    Active member of club or organization: Not on file    Attends meetings of clubs or organizations: Not on file    Relationship status: Not on file  . Intimate partner violence:  Fear of current or ex partner: Not on file    Emotionally abused: Not on file    Physically abused: Not on file    Forced sexual activity: Not on file  Other Topics Concern  . Not on file  Social History Narrative  . Not on file   History reviewed. No pertinent family history.  VITAL SIGNS BP (!) 156/71   Pulse 84   Temp 98.1 F (36.7 C) (Oral)   Resp 20   Ht 5\' 7"  (1.702 m)   Wt 180 lb 12.8 oz (82 kg)   SpO2 96%   BMI 28.32 kg/m   Patient's Medications  New Prescriptions   No medications on file  Previous Medications   ACETAMINOPHEN (TYLENOL) 325 MG TABLET    Take 2 tablets (650 mg total) by mouth every 6 (six) hours as needed for mild pain (or Fever >/= 101).   ALBUTEROL (PROVENTIL HFA;VENTOLIN HFA) 108 (90 BASE) MCG/ACT INHALER    Inhale 1 puff into the lungs every 6  (six) hours as needed for wheezing or shortness of breath.   CHOLECALCIFEROL 4000 UNITS CAPS    Take 4,000 Units by mouth daily.   CLOPIDOGREL (PLAVIX) 75 MG TABLET    Take 75 mg by mouth daily with breakfast.    FEBUXOSTAT (ULORIC) 40 MG TABLET    Take 40 mg by mouth daily with supper.   FLUTICASONE (FLONASE) 50 MCG/ACT NASAL SPRAY    Place 2 sprays into the nose 2 (two) times daily as needed (seasonal allergies).    FUROSEMIDE (LASIX) 40 MG TABLET    Take 40 mg by mouth daily as needed for fluid or edema (weight gain of 3 lbs over a couple days).    GLIPIZIDE (GLUCOTROL XL) 10 MG 24 HR TABLET    Take 10 mg by mouth daily with breakfast.   HYDROCODONE-ACETAMINOPHEN (NORCO/VICODIN) 5-325 MG TABLET    Take 1 tablet by mouth every 4 (four) hours as needed.   LORATADINE (CLARITIN) 10 MG TABLET    Take 10 mg by mouth daily as needed (seasonal allergies).    LOVASTATIN (MEVACOR) 40 MG TABLET    Take 40 mg by mouth daily with supper.    MAGNESIUM OXIDE (MAG-OX) 400 MG TABLET    Take 400 mg by mouth daily with breakfast.   MECLIZINE (ANTIVERT) 25 MG TABLET    Take 25 mg by mouth 3 (three) times daily as needed for dizziness.    METFORMIN (GLUCOPHAGE-XR) 500 MG 24 HR TABLET    Take 500 mg by mouth daily with supper. for blood glucose control take with supper   METOPROLOL TARTRATE (LOPRESSOR) 25 MG TABLET    Take 25 mg by mouth 2 (two) times daily with a meal.    NITROGLYCERIN (NITROSTAT) 0.4 MG SL TABLET    Place 0.4 mg under the tongue every 5 (five) minutes as needed for chest pain.    OMEGA-3 FATTY ACIDS (FISH OIL) 1200 MG CAPS    Take 1,200 mg by mouth daily with supper.   OXYCODONE (OXY IR/ROXICODONE) 5 MG IMMEDIATE RELEASE TABLET    Take 1 tablet (5 mg total) by mouth every 4 (four) hours as needed for moderate pain or severe pain.   POLYETHYLENE GLYCOL (MIRALAX / GLYCOLAX) PACKET    Take 17 g by mouth 2 (two) times daily.   POTASSIUM CHLORIDE SA (K-DUR,KLOR-CON) 20 MEQ TABLET    Take 20 mEq by mouth  daily with breakfast.    PREGABALIN (LYRICA)  150 MG CAPSULE    Take 1 capsule (150 mg total) by mouth 2 (two) times daily with a meal.   QUINAPRIL (ACCUPRIL) 5 MG TABLET    Take 5 mg by mouth daily with breakfast.    WARFARIN (COUMADIN) 5 MG TABLET    Take 2.5-5 mg by mouth See admin instructions. Take 1/2 tablet (2.5 mg) by mouth on Monday, Wednesday, Friday with supper; take 1 tablet (5 mg) on Sunday, Tuesday, Thursday, Saturday with supper  Modified Medications   No medications on file  Discontinued Medications   ACETAMINOPHEN (TYLENOL) 500 MG TABLET    Take 1,000 mg by mouth every 6 (six) hours as needed for headache (pain).   LOPERAMIDE (IMODIUM) 2 MG CAPSULE    Take 2 mg by mouth every 4 (four) hours as needed for diarrhea or loose stools.      SIGNIFICANT DIAGNOSTIC EXAMS   LABS REVIEWED TODAY;   11-16-17: wbc 5.8; hgb 11.3; hct 31.9; mcv 97.8; plt 155; glucose 165; bun 31; creat 1.16; k+ 5.5; na++ 139; ca 9.5; liver normal albumin 4.0 INR 1.81  Review of Systems  Constitutional: Negative for malaise/fatigue.  Respiratory: Negative for cough and shortness of breath.   Cardiovascular: Negative for chest pain, palpitations and leg swelling.  Gastrointestinal: Negative for abdominal pain, constipation and heartburn.  Musculoskeletal: Negative for back pain, joint pain and myalgias.  Skin: Negative.   Neurological: Negative for dizziness.  Psychiatric/Behavioral: The patient is not nervous/anxious.     Physical Exam  Constitutional: He is oriented to person, place, and time. He appears well-developed and well-nourished. No distress.  Neck: No thyromegaly present.  Cardiovascular: Normal rate, regular rhythm, normal heart sounds and intact distal pulses.  Pulmonary/Chest: Effort normal and breath sounds normal. No respiratory distress.  Abdominal: Soft. Bowel sounds are normal. He exhibits no distension. There is no tenderness.  Musculoskeletal: Normal range of motion. He exhibits  no edema.  Right lower extremity in cam boot Sutures without signs of infection   Lymphadenopathy:    He has no cervical adenopathy.  Neurological: He is alert and oriented to person, place, and time.  Skin: Skin is warm and dry. He is not diaphoretic.  Psychiatric: He has a normal mood and affect.     ASSESSMENT/ PLAN:   Patient is being discharged with the following home health services:  Pt/ot to evaluate and treat as indicated for gait balance strength adl training. INR due 11-23-17.   Patient is being discharged with the following durable medical equipment:  None required   Patient has been advised to f/u with their PCP in 1-2 weeks to bring them up to date on their rehab stay.  Social services at facility was responsible for arranging this appointment.  Pt was provided with a 30 day supply of prescriptions for medications and refills must be obtained from their PCP.  For controlled substances, a more limited supply may be provided adequate until PCP appointment only.   A 30 day supply of his prescription medications have been written as listed above #60 lyrica 150 mg tabs.   Time spent with patient 40 minutes: discussed medications; home health expectations. He has all necessary dme; has good family support. Verbalized understanding.    Synthia Innocent NP [facility]  Contact [phone] Monday through Friday 8am- 5pm  After hours call [phone]

## 2018-01-28 ENCOUNTER — Ambulatory Visit: Payer: Medicare HMO | Admitting: Nurse Practitioner

## 2018-12-13 IMAGING — CR DG TIBIA/FIBULA PORT 2V*R*
4 series · 4 of 4 positions shown · non-contrast
Comparison: Intraoperative imaging this same day.

CLINICAL DATA: Status post fixation of a tibial fracture today. The
patient suffered the fracture in a fall 11/05/2017. Initial
encounter.

EXAM:
PORTABLE RIGHT TIBIA AND FIBULA - 2 VIEW

[AP (1 of 2)]
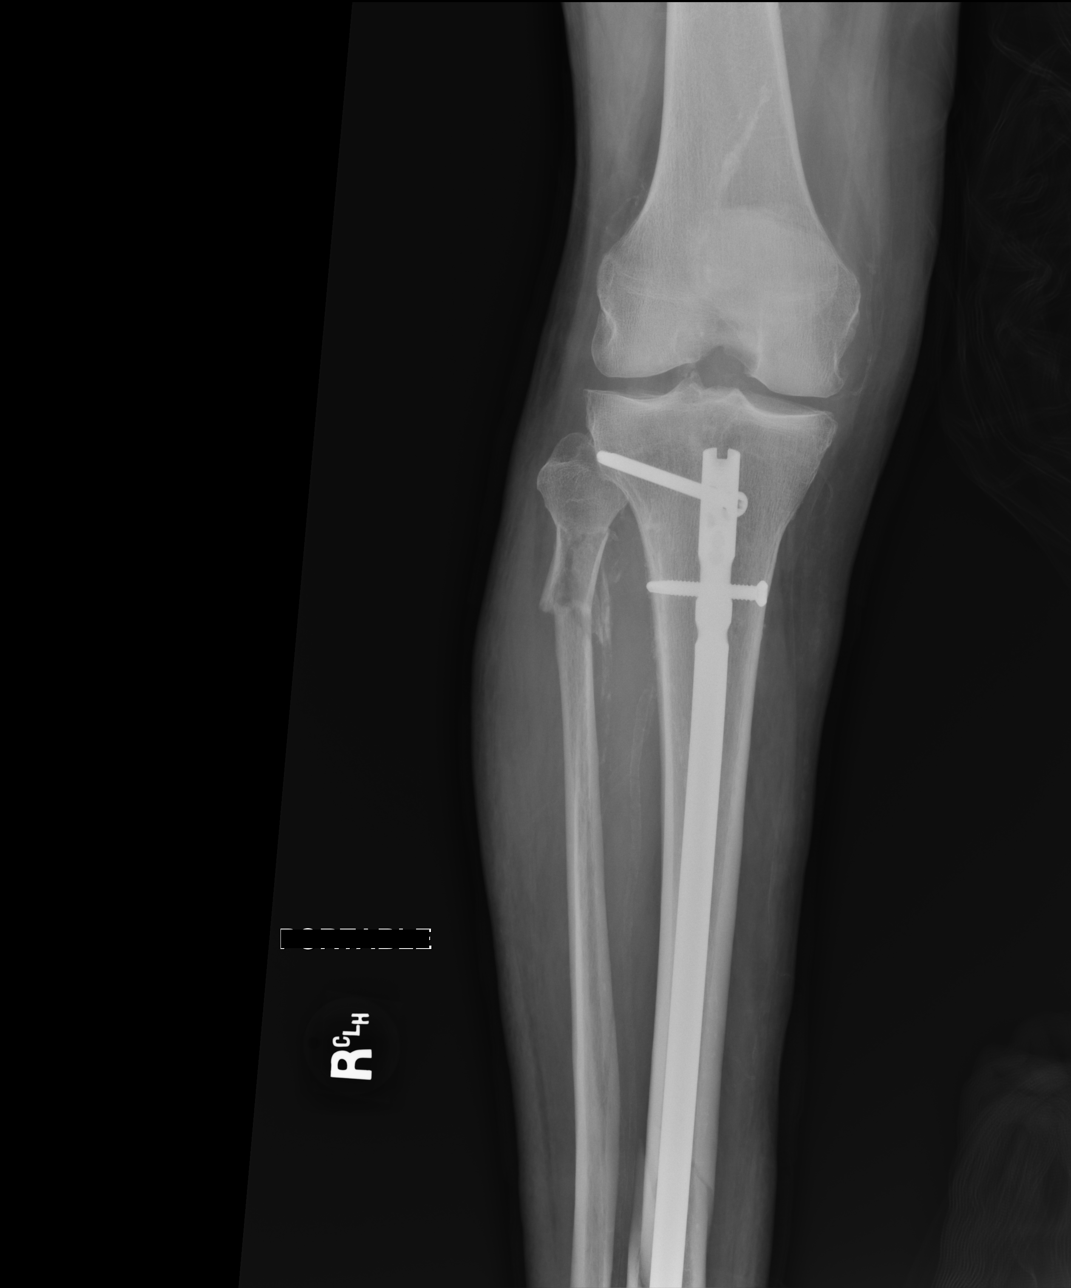

[lateral (1 of 2)]
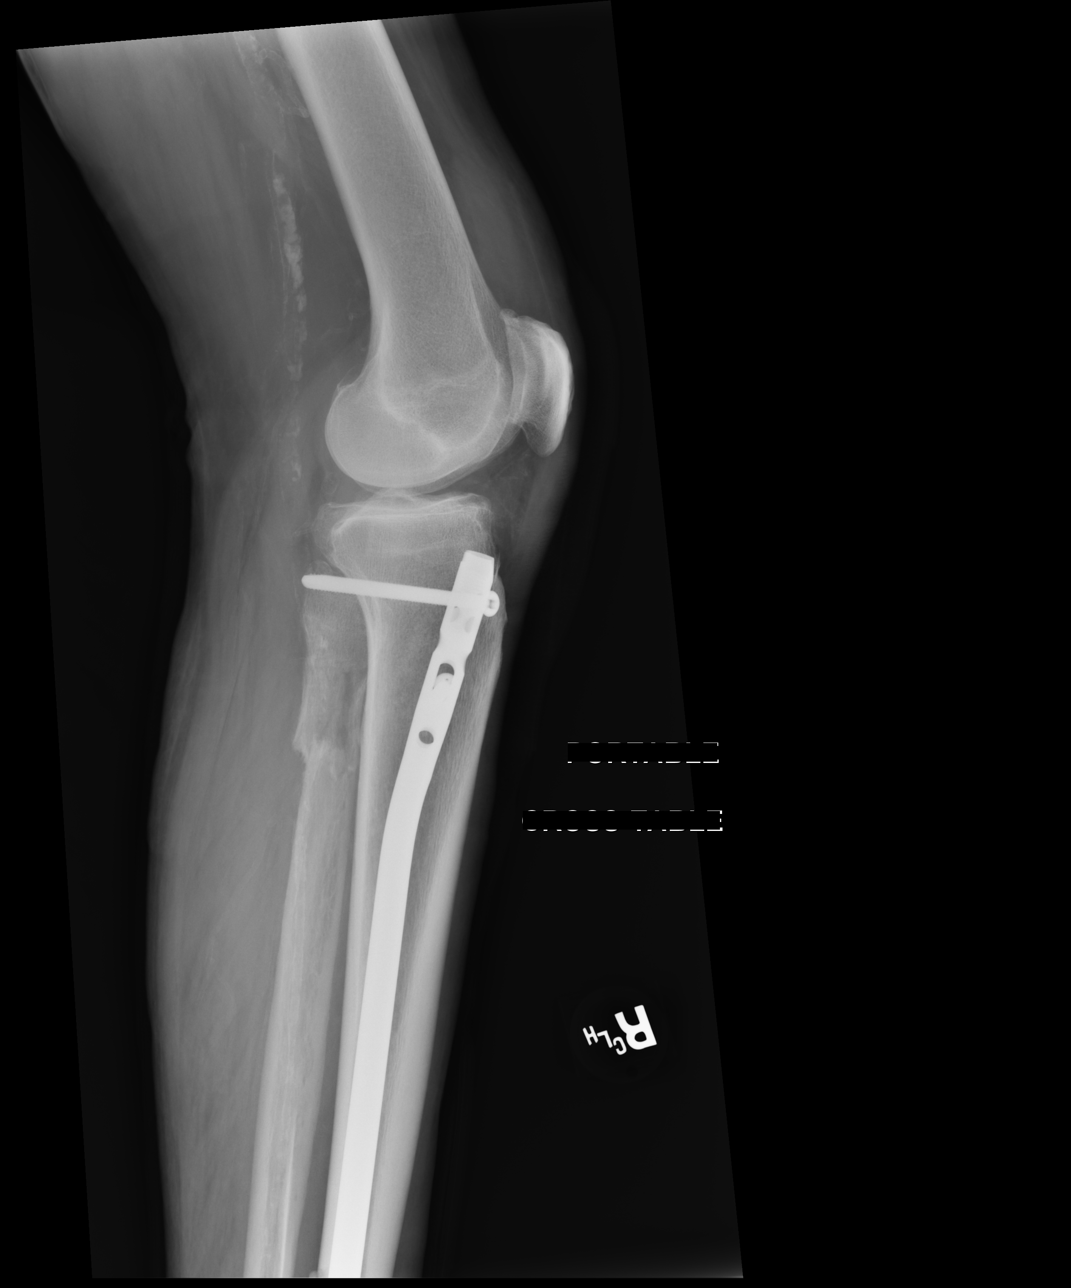

[AP (2 of 2)]
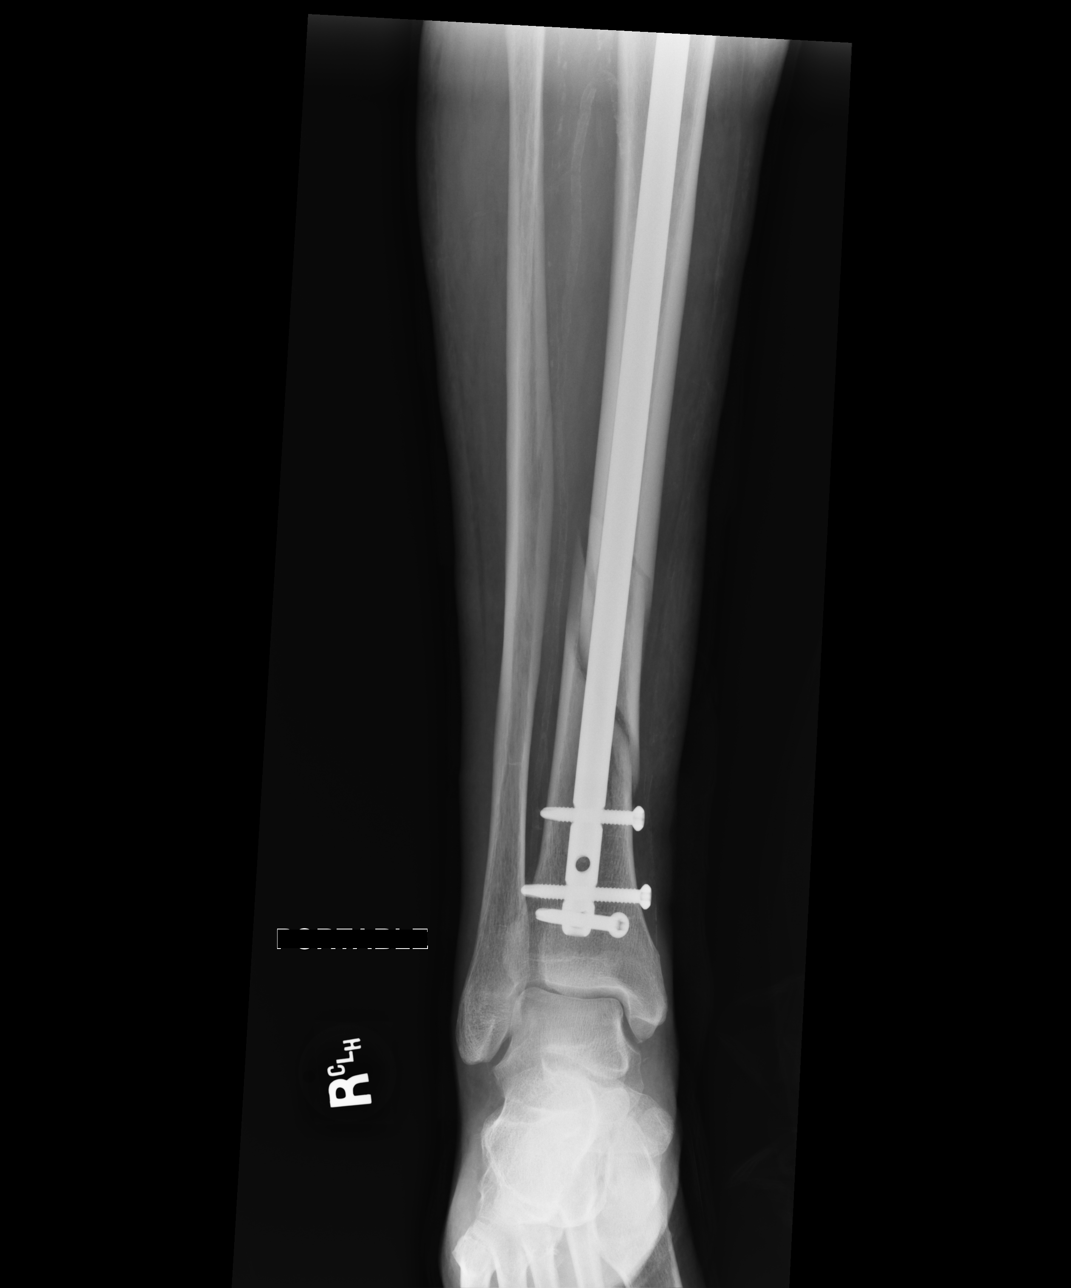

[lateral (2 of 2)]
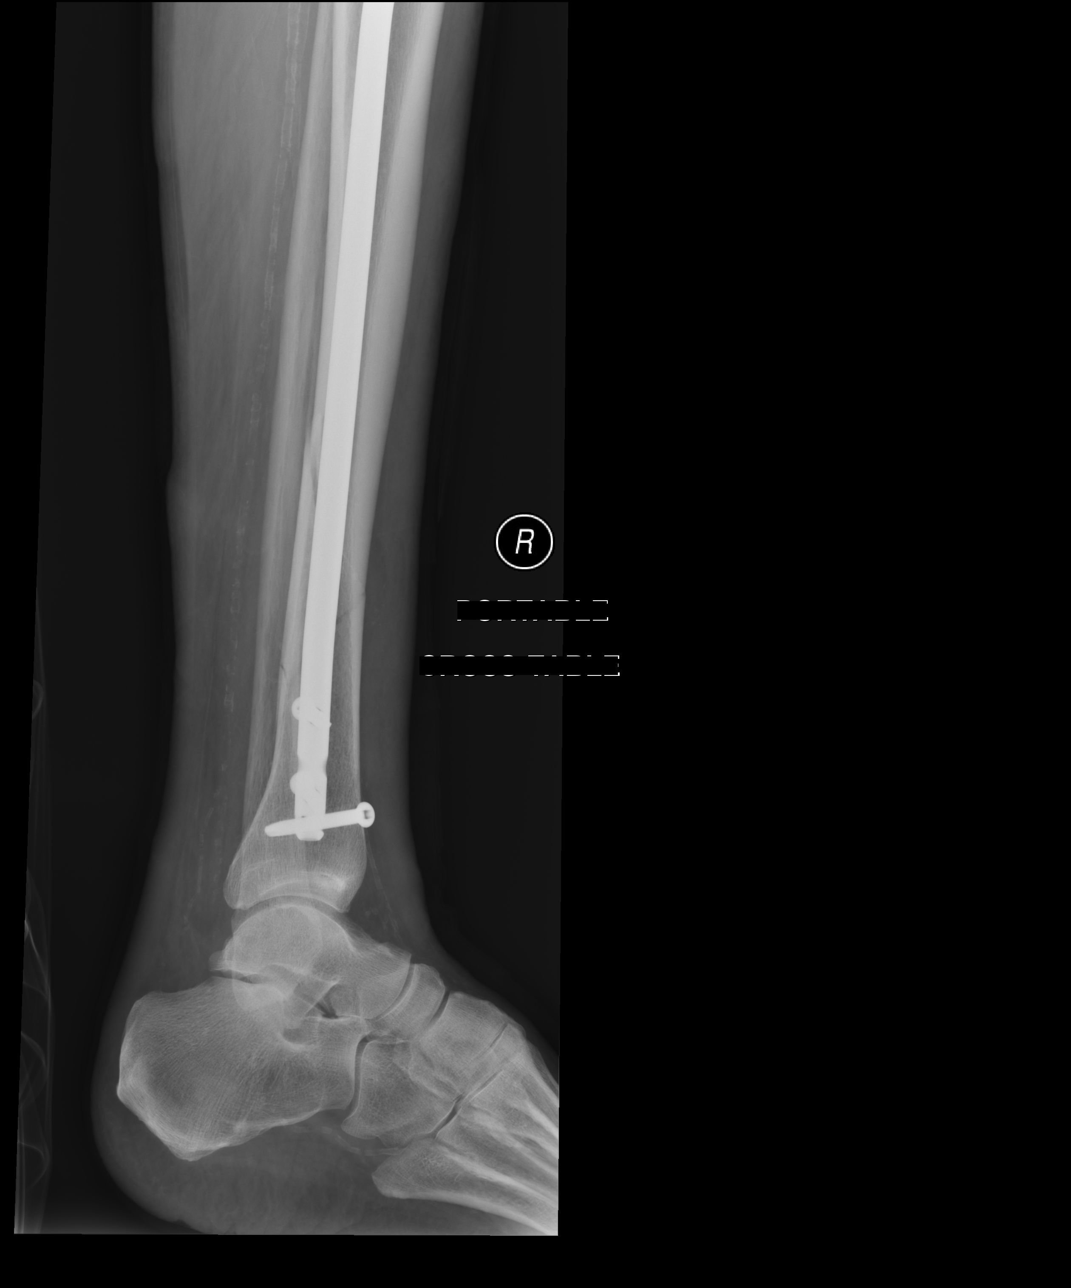

[4 of 4 positions shown; findings below may reference images not displayed]

FINDINGS: Intramedullary nail with two proximal and three distal screws
localize a spiral fracture of the distal tibia. Positioned and
alignment are improved. Proximal fibular fracture is again seen. No
new abnormality.
IMPRESSION: Status post ORIF of right tibial fracture. Position and alignment
are improved. No acute abnormality.

## 2019-08-08 ENCOUNTER — Other Ambulatory Visit: Payer: Self-pay | Admitting: Family Medicine

## 2019-08-08 DIAGNOSIS — R059 Cough, unspecified: Secondary | ICD-10-CM

## 2019-08-08 DIAGNOSIS — R05 Cough: Secondary | ICD-10-CM

## 2019-08-18 DEATH — deceased
